# Patient Record
Sex: Female | Born: 1950 | Race: Black or African American | Hispanic: No | Smoking: Current every day smoker
Health system: Southern US, Community
[De-identification: ages and names within clinical notes are randomized; demographics above are authoritative.]

## PROBLEM LIST (undated history)

## (undated) DIAGNOSIS — K7460 Unspecified cirrhosis of liver: Secondary | ICD-10-CM

## (undated) HISTORY — PX: INTRAOCULAR LENS INSERTION: SHX110

---

## 2003-06-11 ENCOUNTER — Emergency Department (HOSPITAL_COMMUNITY): Admission: EM | Admit: 2003-06-11 | Discharge: 2003-06-11 | Payer: Self-pay | Admitting: Emergency Medicine

## 2004-05-09 ENCOUNTER — Ambulatory Visit: Payer: Self-pay | Admitting: Internal Medicine

## 2004-05-09 ENCOUNTER — Inpatient Hospital Stay (HOSPITAL_COMMUNITY): Admission: EM | Admit: 2004-05-09 | Discharge: 2004-05-13 | Payer: Self-pay | Admitting: Emergency Medicine

## 2004-05-20 ENCOUNTER — Ambulatory Visit: Payer: Self-pay | Admitting: Internal Medicine

## 2004-06-10 ENCOUNTER — Ambulatory Visit: Payer: Self-pay | Admitting: Internal Medicine

## 2008-03-07 ENCOUNTER — Emergency Department (HOSPITAL_COMMUNITY): Admission: EM | Admit: 2008-03-07 | Discharge: 2008-03-07 | Payer: Self-pay | Admitting: Family Medicine

## 2008-06-15 ENCOUNTER — Emergency Department (HOSPITAL_COMMUNITY): Admission: EM | Admit: 2008-06-15 | Discharge: 2008-06-15 | Payer: Self-pay | Admitting: Family Medicine

## 2010-11-22 NOTE — Discharge Summary (Signed)
NAME:  [name], [name] NO.:  [id number]   MEDICAL RECORD NO.:  [id number]          PATIENT TYPE:  INP   LOCATION:  5506                         FACILITY:  [facility]   PHYSICIAN:  Alvester Morin, M.D.  DATE OF BIRTH:  [birth year]   DATE OF ADMISSION:  05/09/2004  DATE OF DISCHARGE:  05/13/2004                                 DISCHARGE SUMMARY   DISCHARGE DIAGNOSES:  1.  Pyelonephritis.  2.  Urinary tract infection  3.  Hepatitis C virus positive.   DISCHARGE INSTRUCTIONS:  The patient was admitted in the hospital for acute  abdominal pain for four days.  Discharge medicine was Bactrim 1 tablet twice  a day for ten days.  The patient is to follow up in [facility] on  11/14 at 2 p.m. to be seen by Faythe Ghee and Dr. Eliane Decree, to get a stat  CBC, CMET, urinalysis, urine micro on that day, also to review the result of  hepatitis C virus, viral load and to discuss testing for human  immunodeficiency virus; if needed to get an appointment at hepatitis C  clinic.   PROCEDURE:  The patient got an ultrasound on 11/3 showing multiple small  gallstones.  No dilatation of gallbladder.  No signs of obstruction.  The  rest was normal.   CONSULTATIONS:  None.   HISTORY OF PRESENT ILLNESS:  [name] is a 60 year old very pleasant  African-American female patient who presented with acute abdominal pain for  four days, mostly in the right side flank area, not radiating, 5/10 on  scale, dull aching.  No aggravating or alleviating factors, not associated  with any meals.  The patient had taken Theraflu with very little relief.  The patient also had complained of nausea and vomiting for four days,  associated with incontinence, loose runny stool on the day of admission.  The patient had not noticed any bright red blood in the urine.  No burning  associated.   PHYSICAL EXAMINATION:  VITAL SIGNS:  Temperature 101.3, pulse between 78-  106, blood pressure 96/63,  respiratory rate 18, O2 saturation 96% on room  air.  GENERAL:  [name] built African-American lady in no acute distress.  HEENT:  Eyes:  Pupils round and reactive to light.  Extraocular movements  intact.  No scleral icterus.  Normocephalic.  Normal tympanic membranes.  Dry mucosa.  White coating on tongue.  Not easily removable.  Good condition  of oropharynx.  NECK:  Supple.  No bruit or JVD.  No lymphadenopathy.  No thyromegaly.  LUNGS:  Air entry bilaterally equal.  No rales or rhonchi appreciated.  CARDIOVASCULAR:  Tachycardia, regular rate and rhythm.  No murmurs, gallops,  rubs appreciated.  ABDOMEN:  Soft, mildly tender in right upper and lower quadrants, severe  tenderness right lateral side, point tenderness.  No organomegaly.  Bowel  sounds present.  Hyperactive on admission.  Costovertebral angle tenderness.  EXTREMITIES:  No edema, +2 pulses.  SKIN:  Dry, warm.  MUSCULOSKELETAL:  Moves all extremities.  NEUROLOGICAL:  Grossly nonfocal.  PSYCHIATRIC:  Oriented x3.  ADMISSION LABORATORY DATA:  Sodium 135, potassium 3.6, chloride 109, BUN 10,  creatinine 1.3, glucose 117.  Urinalysis showed small bilirubin, small  amount of blood, protein 30, urobilinogen was 8, large amount of urine.  Micro showed 21-50 wbc's, 3-6 rbc's and Trichomonads 5-10.  Hemoglobin 14.9,  hematocrit 42.4, WBC 18.3 and platelets 152.  ANC 13.8, MCV 96.5, lipase 42,  total bilirubin 2.1, direct 1.1, indirect bilirubin 1.0.  Alkaline  phosphatase 102, AST 45, ALT 54.  Total protein 8.2.  Albumin 3.1.   HOSPITAL COURSE/ASSESSMENT/PLAN:  Problem 1. Right upper quadrant abdominal  pain with right lumbar flank pain.  Differential diagnosis included  cholelithiasis, cholangitis, pancreatitis, pyelonephritis, urinary tract  infection and PID.  The patient was febrile.  WBC was elevated.  Due to  possibly of infective gastritis, pain was empirically treated with Cipro IV  and Flagyl IV.  Thinking this would  cover a broad spectrum organism, both  for acute abdomen and urinary tract infection.   Problem 2. Urinary tract infection, trichomoniasis.  The patient was treated  with metronidazole 1000 mg just one dose to cover trichomoniasis.  Urine did  show a few Escherichia coli on culture which was sensitive to antibiotics.  The patient was treated with Cipro 500 mg IV for three days, switched to  Cipro 500 mg b.i.d. for one day and on discharge was sent out on Septra 1000  b.i.d. for ten days.   Problem 3. Nausea, vomiting.  The patient was given Phenergan 25 mg q.4-6h.  p.r.n.   Problem 4. Deep venous thrombosis prophylaxis.  The patient was treated with  40 mg of Lovenox subcutaneously.   Problem 5. During admission, the patient also had three episodes of  hypertension which resolved promptly with IV fluids.   Problem 6. Hypokalemia which was repleted with single dose of KCl, 40 mEq,  and potassium went up to 4 from 3.4.  On discharge it was 3.8.   Problem 7. HCV antibody positive.  This was a coincident finding on  hepatitis panel.  Still pending viral load.  This is to be followed up in  clinic on 11/14.  The patient will be given an appointment at the hepatitis  clinic in [place].   DISCHARGE LABORATORY DATA:  Sodium 136, potassium 3.8, chloride 105,  bicarbonate 25, BUN 6, creatinine 1.3, glucose 91.  Hemoglobin 13.0,  hematocrit 36.7, WBC 10.7.  Glucose 145.   FOLLOWUP:  The patient follows up on 11/14 at 2 p.m. in the clinic to get  CBC, CMET, urinalysis and urine micro to monitor the effect of therapy.  Also to follow up with pending viral load for hepatitis C.  To discuss  testing for human immunodeficiency virus as mentioned earlier.       SP/MEDQ  D:  05/13/2004  T:  05/13/2004  Job:  [id number]   cc:   Donald Pore, MD  Internal Medicine Resident - [address]  [place]  [place] [address]  Fax: [phone]

## 2011-01-30 ENCOUNTER — Emergency Department (HOSPITAL_COMMUNITY)
Admission: EM | Admit: 2011-01-30 | Discharge: 2011-01-30 | Disposition: A | Discharge status: Home / Self Care | Payer: Self-pay | Attending: Emergency Medicine | Admitting: Emergency Medicine

## 2011-01-30 ENCOUNTER — Emergency Department (HOSPITAL_COMMUNITY): Payer: Self-pay

## 2011-01-30 DIAGNOSIS — E876 Hypokalemia: Secondary | ICD-10-CM | POA: Insufficient documentation

## 2011-01-30 DIAGNOSIS — R0602 Shortness of breath: Secondary | ICD-10-CM | POA: Insufficient documentation

## 2011-01-30 DIAGNOSIS — R42 Dizziness and giddiness: Secondary | ICD-10-CM | POA: Insufficient documentation

## 2011-01-30 DIAGNOSIS — R5381 Other malaise: Secondary | ICD-10-CM | POA: Insufficient documentation

## 2011-01-30 DIAGNOSIS — R5383 Other fatigue: Secondary | ICD-10-CM | POA: Insufficient documentation

## 2011-01-30 LAB — DIFFERENTIAL
Basophils Absolute: 0.1 10*3/uL (ref 0.0–0.1)
Basophils Relative: 1 % (ref 0–1)
Eosinophils Absolute: 0.1 10*3/uL (ref 0.0–0.7)
Eosinophils Relative: 2 % (ref 0–5)
Lymphocytes Relative: 54 % — ABNORMAL HIGH (ref 12–46)
Lymphs Abs: 3.7 10*3/uL (ref 0.7–4.0)
Monocytes Absolute: 0.8 10*3/uL (ref 0.1–1.0)
Neutro Abs: 2.3 10*3/uL (ref 1.7–7.7)
Neutrophils Relative %: 33 % — ABNORMAL LOW (ref 43–77)

## 2011-01-30 LAB — CBC
HCT: 40.8 % (ref 36.0–46.0)
Hemoglobin: 14.7 g/dL (ref 12.0–15.0)
MCHC: 36 g/dL (ref 30.0–36.0)
MCV: 95.3 fL (ref 78.0–100.0)
RBC: 4.28 MIL/uL (ref 3.87–5.11)
WBC: 6.9 10*3/uL (ref 4.0–10.5)

## 2011-01-30 LAB — BASIC METABOLIC PANEL
BUN: 13 mg/dL (ref 6–23)
CO2: 23 mEq/L (ref 19–32)
Calcium: 9.6 mg/dL (ref 8.4–10.5)
Chloride: 104 mEq/L (ref 96–112)
Creatinine, Ser: 1.06 mg/dL (ref 0.50–1.10)
GFR calc Af Amer: >60 mL/min (ref >60)
GFR calc non Af Amer: 53 mL/min — ABNORMAL LOW (ref >60)
Glucose, Bld: 112 mg/dL — ABNORMAL HIGH (ref 70–99)
Potassium: 3.3 mEq/L — ABNORMAL LOW (ref 3.5–5.1)
Sodium: 136 mEq/L (ref 135–145)

## 2011-01-30 LAB — CK TOTAL AND CKMB (NOT AT ARMC)
CK, MB: 2.9 ng/mL (ref 0.3–4.0)
Relative Index: 1.6 (ref 0.0–2.5)
Total CK: 185 U/L — ABNORMAL HIGH (ref 7–177)

## 2011-01-30 LAB — TROPONIN I: Troponin I: <0.3 ng/mL (ref <0.30)

## 2011-01-30 LAB — GLUCOSE, CAPILLARY: Glucose-Capillary: 131 mg/dL — ABNORMAL HIGH (ref 70–99)

## 2011-02-10 ENCOUNTER — Encounter: Payer: Self-pay | Admitting: Cardiovascular Disease

## 2011-02-11 ENCOUNTER — Encounter: Payer: Self-pay | Admitting: Cardiovascular Disease

## 2011-02-11 ENCOUNTER — Ambulatory Visit (INDEPENDENT_AMBULATORY_CARE_PROVIDER_SITE_OTHER): Payer: Self-pay | Admitting: Cardiovascular Disease

## 2011-02-11 VITALS — BP 128/76 | HR 78 | Ht 65.0 in | Wt 172.0 lb

## 2011-02-11 DIAGNOSIS — R42 Dizziness and giddiness: Secondary | ICD-10-CM

## 2011-02-11 NOTE — Assessment & Plan Note (Signed)
I do not think this is cardiac related. No further w/u at this time in this pt whose only cardiac risk factor is tobacco use. EKG normal. Cardiac panel negative last week. Exam normal. F/u prn.

## 2011-02-11 NOTE — Progress Notes (Signed)
History of Present Illness:59 yo female with h/o tobacco abuse here today to establish cardiology care. She was seen in the ED two weeks ago at [facility] after an episode of dizziness. She eased down to the floor. She then felt like she could not catch her breath. Normal head CT in the ED. CXR was normal. Her EKG was normal. No chest pain. Cardiac enzymes and d-dimer were normal. She has felt better at home. She has had some stomach pains with diarrhea. She has been taking in extra fluids. No recurrence of the dizziness, SOB since getting home. No fever or chills.   No past medical history on file.  Past Surgical History  Procedure Date  . Intraocular lens insertion     No current outpatient prescriptions on file.    Allergies not on file  History   Social History  . Marital Status: Widowed    Spouse Name: N/A    Number of Children: N/A  . Years of Education: N/A   Occupational History  . Not on file.   Social History Main Topics  . Smoking status: Current Everyday Smoker -- 0.0 packs/day    Types: Cigarettes  . Smokeless tobacco: Not on file  . Alcohol Use: Yes  . Drug Use: Not on file  . Sexually Active: Not on file   Other Topics Concern  . Not on file   Social History Narrative  . No narrative on file    No family history on file.  Review of Systems:  As stated in the HPI and otherwise negative.   BP 128/76  Pulse 78  Ht 5\' 5"  (1.651 m)  Wt 172 lb (78.019 kg)  BMI 28.62 kg/m2  Physical Examination: General: Well developed, well nourished, NAD HEENT: OP clear, mucus membranes moist SKIN: warm, dry. No rashes. Neuro: No focal deficits Musculoskeletal: Muscle strength 5/5 all ext Psychiatric: Mood and affect normal Neck: No JVD, no carotid bruits, no thyromegaly, no lymphadenopathy. Lungs:Clear bilaterally, no wheezes, rhonci, crackles Cardiovascular: Regular rate and rhythm. No murmurs, gallops or rubs. Abdomen:Soft. Bowel sounds present. Non-tender.    Extremities: No lower extremity edema. Pulses are 2 + in the bilateral DP/PT.  EKG: 01/30/11: Sinus rhythm. Short PR interval.

## 2011-04-11 LAB — URINE CULTURE: Colony Count: >=100000

## 2011-04-11 LAB — WET PREP, GENITAL: Yeast Wet Prep HPF POC: NONE SEEN

## 2011-04-11 LAB — GC/CHLAMYDIA PROBE AMP, GENITAL
Chlamydia, DNA Probe: NEGATIVE
GC Probe Amp, Genital: NEGATIVE

## 2011-04-11 LAB — POCT URINALYSIS DIP (DEVICE)
Glucose, UA: NEGATIVE mg/dL
Ketones, ur: NEGATIVE mg/dL
Nitrite: POSITIVE — AB
Protein, ur: 30 mg/dL — AB
Specific Gravity, Urine: 1.015 (ref 1.005–1.030)
Urobilinogen, UA: 2 mg/dL — ABNORMAL HIGH (ref 0.0–1.0)
pH: 5.5 (ref 5.0–8.0)

## 2013-04-28 ENCOUNTER — Other Ambulatory Visit: Payer: Self-pay | Admitting: Family Medicine

## 2013-04-28 DIAGNOSIS — R748 Abnormal levels of other serum enzymes: Secondary | ICD-10-CM

## 2013-04-28 DIAGNOSIS — D696 Thrombocytopenia, unspecified: Secondary | ICD-10-CM

## 2013-04-28 DIAGNOSIS — L299 Pruritus, unspecified: Secondary | ICD-10-CM

## 2013-05-05 ENCOUNTER — Ambulatory Visit
Admission: RE | Admit: 2013-05-05 | Discharge: 2013-05-05 | Disposition: A | Discharge status: Home / Self Care | Payer: Medicaid Other | Source: Ambulatory Visit | Attending: Family Medicine | Admitting: Family Medicine

## 2013-05-05 DIAGNOSIS — D696 Thrombocytopenia, unspecified: Secondary | ICD-10-CM

## 2013-05-05 DIAGNOSIS — R748 Abnormal levels of other serum enzymes: Secondary | ICD-10-CM

## 2013-05-05 DIAGNOSIS — L299 Pruritus, unspecified: Secondary | ICD-10-CM

## 2013-06-14 ENCOUNTER — Inpatient Hospital Stay (HOSPITAL_COMMUNITY)
Admission: EM | Admit: 2013-06-14 | Discharge: 2013-07-07 | DRG: 853 | Disposition: E | Payer: Medicaid Other | Attending: Critical Care Medicine | Admitting: Critical Care Medicine

## 2013-06-14 ENCOUNTER — Encounter (HOSPITAL_COMMUNITY): Payer: Self-pay | Admitting: Emergency Medicine

## 2013-06-14 DIAGNOSIS — E872 Acidosis, unspecified: Secondary | ICD-10-CM | POA: Diagnosis present

## 2013-06-14 DIAGNOSIS — K55069 Acute infarction of intestine, part and extent unspecified: Secondary | ICD-10-CM | POA: Diagnosis present

## 2013-06-14 DIAGNOSIS — J80 Acute respiratory distress syndrome: Secondary | ICD-10-CM | POA: Diagnosis present

## 2013-06-14 DIAGNOSIS — E162 Hypoglycemia, unspecified: Secondary | ICD-10-CM | POA: Diagnosis present

## 2013-06-14 DIAGNOSIS — Z79899 Other long term (current) drug therapy: Secondary | ICD-10-CM

## 2013-06-14 DIAGNOSIS — J9600 Acute respiratory failure, unspecified whether with hypoxia or hypercapnia: Secondary | ICD-10-CM | POA: Diagnosis not present

## 2013-06-14 DIAGNOSIS — G9340 Encephalopathy, unspecified: Secondary | ICD-10-CM | POA: Diagnosis present

## 2013-06-14 DIAGNOSIS — K7030 Alcoholic cirrhosis of liver without ascites: Secondary | ICD-10-CM | POA: Diagnosis present

## 2013-06-14 DIAGNOSIS — A419 Sepsis, unspecified organism: Principal | ICD-10-CM | POA: Diagnosis present

## 2013-06-14 DIAGNOSIS — F1010 Alcohol abuse, uncomplicated: Secondary | ICD-10-CM | POA: Diagnosis present

## 2013-06-14 DIAGNOSIS — J9601 Acute respiratory failure with hypoxia: Secondary | ICD-10-CM

## 2013-06-14 DIAGNOSIS — D684 Acquired coagulation factor deficiency: Secondary | ICD-10-CM | POA: Diagnosis present

## 2013-06-14 DIAGNOSIS — N179 Acute kidney failure, unspecified: Secondary | ICD-10-CM | POA: Diagnosis present

## 2013-06-14 DIAGNOSIS — K7460 Unspecified cirrhosis of liver: Secondary | ICD-10-CM

## 2013-06-14 DIAGNOSIS — F17200 Nicotine dependence, unspecified, uncomplicated: Secondary | ICD-10-CM | POA: Diagnosis present

## 2013-06-14 DIAGNOSIS — R188 Other ascites: Secondary | ICD-10-CM | POA: Diagnosis present

## 2013-06-14 DIAGNOSIS — K55059 Acute (reversible) ischemia of intestine, part and extent unspecified: Secondary | ICD-10-CM | POA: Diagnosis present

## 2013-06-14 DIAGNOSIS — D649 Anemia, unspecified: Secondary | ICD-10-CM | POA: Diagnosis not present

## 2013-06-14 DIAGNOSIS — D696 Thrombocytopenia, unspecified: Secondary | ICD-10-CM | POA: Diagnosis present

## 2013-06-14 DIAGNOSIS — D7589 Other specified diseases of blood and blood-forming organs: Secondary | ICD-10-CM | POA: Diagnosis not present

## 2013-06-14 HISTORY — DX: Unspecified cirrhosis of liver: K74.60

## 2013-06-14 LAB — CBC WITH DIFFERENTIAL/PLATELET
Basophils Absolute: 0 10*3/uL (ref 0.0–0.1)
Basophils Relative: 0 % (ref 0–1)
Eosinophils Absolute: 0 10*3/uL (ref 0.0–0.7)
Eosinophils Relative: 0 % (ref 0–5)
HCT: 37.1 % (ref 36.0–46.0)
Hemoglobin: 12.9 g/dL (ref 12.0–15.0)
Lymphocytes Relative: 14 % (ref 12–46)
Lymphs Abs: 0.7 10*3/uL (ref 0.7–4.0)
MCH: 35.4 pg — ABNORMAL HIGH (ref 26.0–34.0)
MCHC: 34.8 g/dL (ref 30.0–36.0)
MCV: 101.9 fL — ABNORMAL HIGH (ref 78.0–100.0)
Monocytes Absolute: 0.3 10*3/uL (ref 0.1–1.0)
Monocytes Relative: 7 % (ref 3–12)
Neutro Abs: 3.9 10*3/uL (ref 1.7–7.7)
Neutrophils Relative %: 79 % — ABNORMAL HIGH (ref 43–77)
Platelets: 76 10*3/uL — ABNORMAL LOW (ref 150–400)
RBC: 3.64 MIL/uL — ABNORMAL LOW (ref 3.87–5.11)
RDW: 16 % — ABNORMAL HIGH (ref 11.5–15.5)
WBC Morphology: INCREASED
WBC: 4.9 10*3/uL (ref 4.0–10.5)

## 2013-06-14 LAB — COMPREHENSIVE METABOLIC PANEL
ALT: 42 U/L — ABNORMAL HIGH (ref 0–35)
AST: 94 U/L — ABNORMAL HIGH (ref 0–37)
Albumin: 1.3 g/dL — ABNORMAL LOW (ref 3.5–5.2)
Alkaline Phosphatase: 257 U/L — ABNORMAL HIGH (ref 39–117)
BUN: 18 mg/dL (ref 6–23)
CO2: 12 mEq/L — ABNORMAL LOW (ref 19–32)
Calcium: 8.3 mg/dL — ABNORMAL LOW (ref 8.4–10.5)
Chloride: 105 mEq/L (ref 96–112)
Creatinine, Ser: 2.27 mg/dL — ABNORMAL HIGH (ref 0.50–1.10)
GFR calc Af Amer: 25 mL/min — ABNORMAL LOW (ref >90)
GFR calc non Af Amer: 22 mL/min — ABNORMAL LOW (ref >90)
Glucose, Bld: 35 mg/dL — CL (ref 70–99)
Potassium: 4.6 mEq/L (ref 3.5–5.1)
Sodium: 137 mEq/L (ref 135–145)
Total Bilirubin: 3.2 mg/dL — ABNORMAL HIGH (ref 0.3–1.2)
Total Protein: 7.2 g/dL (ref 6.0–8.3)

## 2013-06-14 LAB — CG4 I-STAT (LACTIC ACID): Lactic Acid, Venous: 11.52 mmol/L — ABNORMAL HIGH (ref 0.5–2.2)

## 2013-06-14 LAB — LIPASE, BLOOD: Lipase: 22 U/L (ref 11–59)

## 2013-06-14 LAB — GLUCOSE, CAPILLARY: Glucose-Capillary: 127 mg/dL — ABNORMAL HIGH (ref 70–99)

## 2013-06-14 MED ORDER — FENTANYL CITRATE 0.05 MG/ML IJ SOLN
50.0000 ug | Freq: Once | INTRAMUSCULAR | Status: AC
  Administered 2013-06-14: 50 ug via INTRAVENOUS
  Filled 2013-06-14: qty 2

## 2013-06-14 MED ORDER — ONDANSETRON HCL 4 MG/2ML IJ SOLN
4.0000 mg | Freq: Once | INTRAMUSCULAR | Status: AC
  Administered 2013-06-14: 4 mg via INTRAVENOUS
  Filled 2013-06-14: qty 2

## 2013-06-14 MED ORDER — DEXTROSE 5 % IV SOLN
2.0000 g | Freq: Once | INTRAVENOUS | Status: AC
  Administered 2013-06-14: 2 g via INTRAVENOUS
  Filled 2013-06-14: qty 2

## 2013-06-14 MED ORDER — DEXTROSE 50 % IV SOLN
1.0000 | Freq: Once | INTRAVENOUS | Status: AC
  Administered 2013-06-14: 50 mL via INTRAVENOUS

## 2013-06-14 MED ORDER — DEXTROSE 50 % IV SOLN
INTRAVENOUS | Status: AC
  Administered 2013-06-14: 50 mL via INTRAVENOUS
  Filled 2013-06-14: qty 50

## 2013-06-14 MED ORDER — IOHEXOL 300 MG/ML  SOLN
25.0000 mL | INTRAMUSCULAR | Status: AC
  Administered 2013-06-14: 25 mL via ORAL

## 2013-06-14 MED ORDER — SODIUM CHLORIDE 0.9 % IV BOLUS (SEPSIS)
1000.0000 mL | Freq: Once | INTRAVENOUS | Status: AC
  Administered 2013-06-14: 1000 mL via INTRAVENOUS

## 2013-06-14 NOTE — ED Provider Notes (Signed)
CSN: [id number]     Arrival date & time Jun 18, 2013  1753 History   First MD Initiated Contact with Patient 2013-06-18 1856     Chief Complaint  Patient presents with  . Abdominal Pain   (Consider location/radiation/quality/duration/timing/severity/associated sxs/prior Treatment) HPI Patient presents with concern of abdominal pain, dyspnea. Patient notes that she was recently diagnosed with cirrhosis. She is scheduled to see gastroenterology tomorrow. She notes over the past 2 or 3 days she has had increasing dyspnea, concurrently, she has developed worsening abdominal discomfort, distention. There is some diffuse soreness, but no overt pain. No fever, no chills, no vomiting, no diarrhea.  Patient does describe significant anxiety secondary to dyspnea. No quickly they are assessment factors.  Past Medical History  Diagnosis Date  . Cirrhosis of liver    Past Surgical History  Procedure Laterality Date  . Intraocular lens insertion     No family history on file. History  Substance Use Topics  . Smoking status: Current Every Day Smoker -- 0.00 packs/day    Types: Pipe  . Smokeless tobacco: Not on file  . Alcohol Use: Yes   OB History   Grav Para Term Preterm Abortions TAB SAB Ect Mult Living                 Review of Systems  Constitutional:       Per HPI, otherwise negative  HENT:       Per HPI, otherwise negative  Respiratory:       Per HPI, otherwise negative  Cardiovascular:       Per HPI, otherwise negative  Gastrointestinal: Negative for vomiting.  Endocrine:       Negative aside from HPI  Genitourinary:       Neg aside from HPI   Musculoskeletal:       Per HPI, otherwise negative  Skin: Negative.   Neurological: Negative for syncope.    Allergies  Review of patient's allergies indicates no known allergies.  Home Medications   Current Outpatient Rx  Name  Route  Sig  Dispense  Refill  . cyclobenzaprine (FLEXERIL) 5 MG tablet   Oral   Take 5 mg by  mouth at bedtime.         . Multiple Vitamin (MULTIVITAMIN WITH MINERALS) TABS tablet   Oral   Take 1 tablet by mouth daily.         . naproxen (NAPROSYN) 500 MG tablet   Oral   Take 500 mg by mouth 2 (two) times daily with a meal.         . naproxen sodium (ALEVE) 220 MG tablet   Oral   Take 440 mg by mouth daily as needed.         Marland Kitchen oxybutynin (DITROPAN) 5 MG tablet   Oral   Take 5 mg by mouth daily.         . Potassium 99 MG TABS   Oral   Take 99 mg by mouth at bedtime. Potassium gluconate         . traMADol (ULTRAM) 50 MG tablet   Oral   Take 50 mg by mouth daily as needed (pain).          BP 86/59  Pulse 135  Temp(Src) 97.7 F (36.5 C) (Oral)  Resp 26  SpO2 99% Physical Exam  Nursing note and vitals reviewed. Constitutional: She is oriented to person, place, and time. She appears well-developed and well-nourished. No distress.  HENT:  Head: Normocephalic and  atraumatic.  Eyes: Conjunctivae and EOM are normal.  Cardiovascular: Normal rate and regular rhythm.   Pulmonary/Chest: Effort normal and breath sounds normal. No stridor. No respiratory distress.  Abdominal: Soft. She exhibits distension. She exhibits no fluid wave. There is no tenderness.  Musculoskeletal: She exhibits no edema.  Neurological: She is alert and oriented to person, place, and time. She displays no atrophy and no tremor. No cranial nerve deficit or sensory deficit. She exhibits normal muscle tone. She displays no seizure activity. Coordination normal.  No asterixis  Skin: Skin is warm and dry.  Mildly jaundiced throughout  Psychiatric: She has a normal mood and affect.    ED Course  Procedures (including critical care time) Labs Review Labs Reviewed  CBC WITH DIFFERENTIAL  COMPREHENSIVE METABOLIC PANEL  LIPASE, BLOOD  APTT   Imaging Review No results found.  EKG Interpretation   None      After the initial evaluation I reviewed the patient's chart.  Update:  Initial labs notable for lactic acidosis.  On repeat exam the patient is hypotensive.  IV fluids are running.  Update: Patient's initial labs notable for hypoglycemia.  Patient received IV dextrose.  Update: Remaining labs notable for acute kidney injury.  Patient continues to receive IV fluids. On repeat exam the patient complains of diffuse abdominal discomfort as well as headache. Original ultrasound which was planned is now going to be replaced by a CT scan   11:07 PM Patient has headache, Fentanyl ordered She is sitting upright, speaking clearly.  Update: With her the patient's lactic acidosis, acute kidney injury, hypoglycemia, hypotension I discussed her care with her critical care team.  They will assist with the patient's evaluation and management.  On repeat exam patient's blood pressure has improved, though she continues to complain of pain.  With her abnormal labs she was started on empiric antibiotics, after blood cultures were obtained MDM  No diagnosis found. This patient presents with concern of ongoing abdominal discomfort, headache, generalized discomfort.  Notably, the patient's recent diagnosis of cirrhosis, but has not yet seen a specialist or had additional evaluation. On exam the patient is awake alert, interacting appropriately, but very uncomfortable appearing.  Throughout the patient's emergency room course become apparent that she has multiple abnormalities that need additional evaluation and management.  Most notably, patient's lactic acidosis, hypoglycemia, acute kidney injury, all consistent with progressive liver dysfunction.  Patient's hepatic enzymes are elevated.  Patient required additional evaluation and management by our critical care team and on sign out this was pending.   (late addendum) - on chart review, next day - the CT was grossly abnormal, with concern for pneumoperitoneum, pneumatosis and possible ischemia.  CRITICAL CARE Performed by: Gerhard Munch Total critical care time: 40 Critical care time was exclusive of separately billable procedures and treating other patients. Critical care was necessary to treat or prevent imminent or life-threatening deterioration. Critical care was time spent personally by me on the following activities: development of treatment plan with patient and/or surrogate as well as nursing, discussions with consultants, evaluation of patient's response to treatment, examination of patient, obtaining history from patient or surrogate, ordering and performing treatments and interventions, ordering and review of laboratory studies, ordering and review of radiographic studies, pulse oximetry and re-evaluation of patient's condition.    Gerhard Munch, MD 06-21-13 [phone]

## 2013-06-14 NOTE — ED Notes (Signed)
Pt. Was diagnosed with cirrhosis of liver 2 weeks ago and is having abdominal pain and distention and sob.   Pt. Is alert and oriented X4. Skin is warm and pale. n/v

## 2013-06-14 NOTE — ED Notes (Signed)
Notified MD(Lockwood) and RN Eileen Stanford) of elevated Lactic Acid of 11.52

## 2013-06-15 ENCOUNTER — Encounter (HOSPITAL_COMMUNITY): Payer: Medicaid Other | Admitting: Anesthesiology

## 2013-06-15 ENCOUNTER — Other Ambulatory Visit: Payer: Self-pay

## 2013-06-15 ENCOUNTER — Inpatient Hospital Stay (HOSPITAL_COMMUNITY): Payer: Medicaid Other

## 2013-06-15 ENCOUNTER — Encounter (HOSPITAL_COMMUNITY): Admission: EM | Disposition: E | Payer: Medicaid Other | Source: Home / Self Care | Attending: Critical Care Medicine

## 2013-06-15 ENCOUNTER — Encounter (HOSPITAL_COMMUNITY): Payer: Self-pay | Admitting: Critical Care Medicine

## 2013-06-15 ENCOUNTER — Inpatient Hospital Stay (HOSPITAL_COMMUNITY): Payer: Medicaid Other | Admitting: Anesthesiology

## 2013-06-15 DIAGNOSIS — K7460 Unspecified cirrhosis of liver: Secondary | ICD-10-CM

## 2013-06-15 DIAGNOSIS — J9589 Other postprocedural complications and disorders of respiratory system, not elsewhere classified: Secondary | ICD-10-CM

## 2013-06-15 DIAGNOSIS — J9601 Acute respiratory failure with hypoxia: Secondary | ICD-10-CM | POA: Diagnosis present

## 2013-06-15 DIAGNOSIS — E872 Acidosis: Secondary | ICD-10-CM | POA: Diagnosis present

## 2013-06-15 DIAGNOSIS — A419 Sepsis, unspecified organism: Secondary | ICD-10-CM

## 2013-06-15 DIAGNOSIS — K55069 Acute infarction of intestine, part and extent unspecified: Secondary | ICD-10-CM | POA: Diagnosis present

## 2013-06-15 DIAGNOSIS — J80 Acute respiratory distress syndrome: Secondary | ICD-10-CM | POA: Diagnosis present

## 2013-06-15 DIAGNOSIS — I999 Unspecified disorder of circulatory system: Secondary | ICD-10-CM

## 2013-06-15 DIAGNOSIS — J9600 Acute respiratory failure, unspecified whether with hypoxia or hypercapnia: Secondary | ICD-10-CM

## 2013-06-15 DIAGNOSIS — N179 Acute kidney failure, unspecified: Secondary | ICD-10-CM | POA: Diagnosis present

## 2013-06-15 DIAGNOSIS — D126 Benign neoplasm of colon, unspecified: Secondary | ICD-10-CM

## 2013-06-15 DIAGNOSIS — K7030 Alcoholic cirrhosis of liver without ascites: Secondary | ICD-10-CM | POA: Diagnosis present

## 2013-06-15 DIAGNOSIS — E162 Hypoglycemia, unspecified: Secondary | ICD-10-CM | POA: Diagnosis present

## 2013-06-15 DIAGNOSIS — D696 Thrombocytopenia, unspecified: Secondary | ICD-10-CM | POA: Diagnosis present

## 2013-06-15 HISTORY — PX: APPLICATION OF WOUND VAC: SHX5189

## 2013-06-15 HISTORY — PX: COLON RESECTION: SHX5231

## 2013-06-15 HISTORY — PX: LAPAROTOMY: SHX154

## 2013-06-15 LAB — CBC
HCT: 19.2 % — ABNORMAL LOW (ref 36.0–46.0)
Hemoglobin: 13.2 g/dL (ref 12.0–15.0)
Hemoglobin: 6.1 g/dL — CL (ref 12.0–15.0)
MCH: 35.7 pg — ABNORMAL HIGH (ref 26.0–34.0)
MCHC: 33 g/dL (ref 30.0–36.0)
MCHC: 31.8 g/dL (ref 30.0–36.0)
MCV: 108.1 fL — ABNORMAL HIGH (ref 78.0–100.0)
Platelets: 84 10*3/uL — ABNORMAL LOW (ref 150–400)
Platelets: 112 10*3/uL — ABNORMAL LOW (ref 150–400)
RBC: 1.76 MIL/uL — ABNORMAL LOW (ref 3.87–5.11)
RDW: 16.7 % — ABNORMAL HIGH (ref 11.5–15.5)
RDW: 16.8 % — ABNORMAL HIGH (ref 11.5–15.5)
WBC: 6.5 10*3/uL (ref 4.0–10.5)

## 2013-06-15 LAB — COMPREHENSIVE METABOLIC PANEL
ALT: 51 U/L — ABNORMAL HIGH (ref 0–35)
ALT: 176 U/L — ABNORMAL HIGH (ref 0–35)
AST: 132 U/L — ABNORMAL HIGH (ref 0–37)
AST: 649 U/L — ABNORMAL HIGH (ref 0–37)
Albumin: 1.4 g/dL — ABNORMAL LOW (ref 3.5–5.2)
Alkaline Phosphatase: 234 U/L — ABNORMAL HIGH (ref 39–117)
BUN: 20 mg/dL (ref 6–23)
Calcium: 7.6 mg/dL — ABNORMAL LOW (ref 8.4–10.5)
Creatinine, Ser: 3.71 mg/dL — ABNORMAL HIGH (ref 0.50–1.10)
GFR calc Af Amer: 18 mL/min — ABNORMAL LOW (ref >90)
GFR calc Af Amer: 14 mL/min — ABNORMAL LOW (ref >90)
Glucose, Bld: 72 mg/dL (ref 70–99)
Glucose, Bld: 92 mg/dL (ref 70–99)
Potassium: 5.1 mEq/L (ref 3.5–5.1)
Potassium: 4.7 mEq/L (ref 3.5–5.1)
Sodium: 135 mEq/L (ref 135–145)
Sodium: 147 mEq/L — ABNORMAL HIGH (ref 135–145)
Total Bilirubin: 2.8 mg/dL — ABNORMAL HIGH (ref 0.3–1.2)
Total Protein: 8.1 g/dL (ref 6.0–8.3)
Total Protein: 4.9 g/dL — ABNORMAL LOW (ref 6.0–8.3)

## 2013-06-15 LAB — BASIC METABOLIC PANEL
BUN: 19 mg/dL (ref 6–23)
Chloride: 106 mEq/L (ref 96–112)
Creatinine, Ser: 3.63 mg/dL — ABNORMAL HIGH (ref 0.50–1.10)
GFR calc Af Amer: 14 mL/min — ABNORMAL LOW (ref >90)
GFR calc non Af Amer: 12 mL/min — ABNORMAL LOW (ref >90)
Glucose, Bld: 98 mg/dL (ref 70–99)
Potassium: 5.2 mEq/L — ABNORMAL HIGH (ref 3.5–5.1)
Sodium: 139 mEq/L (ref 135–145)

## 2013-06-15 LAB — POCT I-STAT 7, (LYTES, BLD GAS, ICA,H+H)
Acid-base deficit: 25 mmol/L — ABNORMAL HIGH (ref 0.0–2.0)
Acid-base deficit: 11 mmol/L — ABNORMAL HIGH (ref 0.0–2.0)
Bicarbonate: 7.9 mEq/L — ABNORMAL LOW (ref 20.0–24.0)
Bicarbonate: 17.5 mEq/L — ABNORMAL LOW (ref 20.0–24.0)
Calcium, Ion: 0.77 mmol/L — ABNORMAL LOW (ref 1.13–1.30)
HCT: 30 % — ABNORMAL LOW (ref 36.0–46.0)
HCT: 18 % — ABNORMAL LOW (ref 36.0–46.0)
Hemoglobin: 6.1 g/dL — CL (ref 12.0–15.0)
O2 Saturation: 93 %
O2 Saturation: 96 %
Patient temperature: 34.9
Potassium: 4.4 mEq/L (ref 3.5–5.1)
Sodium: 141 mEq/L (ref 135–145)
Sodium: 149 mEq/L — ABNORMAL HIGH (ref 135–145)
TCO2: 9 mmol/L (ref 0–100)
TCO2: 19 mmol/L (ref 0–100)
pCO2 arterial: 50.7 mmHg — ABNORMAL HIGH (ref 35.0–45.0)
pH, Arterial: 7.134 — CL (ref 7.350–7.450)
pO2, Arterial: 115 mmHg — ABNORMAL HIGH (ref 80.0–100.0)
pO2, Arterial: 98 mmHg (ref 80.0–100.0)

## 2013-06-15 LAB — POCT I-STAT 3, ART BLOOD GAS (G3+)
Acid-base deficit: 14 mmol/L — ABNORMAL HIGH (ref 0.0–2.0)
Acid-base deficit: 19 mmol/L — ABNORMAL HIGH (ref 0.0–2.0)
Bicarbonate: 15.3 mEq/L — ABNORMAL LOW (ref 20.0–24.0)
O2 Saturation: 93 %
O2 Saturation: 78 %
Patient temperature: 94.1
Patient temperature: 95.4
TCO2: 14 mmol/L (ref 0–100)
pCO2 arterial: 55.3 mmHg — ABNORMAL HIGH (ref 35.0–45.0)
pO2, Arterial: 83 mmHg (ref 80.0–100.0)
pO2, Arterial: 62 mmHg — ABNORMAL LOW (ref 80.0–100.0)

## 2013-06-15 LAB — GLUCOSE, CAPILLARY
Glucose-Capillary: 105 mg/dL — ABNORMAL HIGH (ref 70–99)
Glucose-Capillary: 65 mg/dL — ABNORMAL LOW (ref 70–99)
Glucose-Capillary: 67 mg/dL — ABNORMAL LOW (ref 70–99)
Glucose-Capillary: 78 mg/dL (ref 70–99)
Glucose-Capillary: 80 mg/dL (ref 70–99)

## 2013-06-15 LAB — URINALYSIS, ROUTINE W REFLEX MICROSCOPIC
Glucose, UA: 100 mg/dL — AB
Protein, ur: 100 mg/dL — AB
Urobilinogen, UA: 1 mg/dL (ref 0.0–1.0)

## 2013-06-15 LAB — LACTIC ACID, PLASMA: Lactic Acid, Venous: 19.7 mmol/L — ABNORMAL HIGH (ref 0.5–2.2)

## 2013-06-15 LAB — AMMONIA: Ammonia: 119 umol/L — ABNORMAL HIGH (ref 11–60)

## 2013-06-15 LAB — PROTIME-INR
INR: 2.87 — ABNORMAL HIGH (ref 0.00–1.49)
INR: 2.42 — ABNORMAL HIGH (ref 0.00–1.49)
Prothrombin Time: 23.5 seconds — ABNORMAL HIGH (ref 11.6–15.2)

## 2013-06-15 LAB — URINE MICROSCOPIC-ADD ON

## 2013-06-15 LAB — BLOOD PRODUCT ORDER (VERBAL) VERIFICATION

## 2013-06-15 LAB — PREPARE RBC (CROSSMATCH)

## 2013-06-15 LAB — APTT: aPTT: 60 seconds — ABNORMAL HIGH (ref 24–37)

## 2013-06-15 LAB — MRSA PCR SCREENING: MRSA by PCR: NEGATIVE

## 2013-06-15 SURGERY — COLON RESECTION
Anesthesia: General | Site: Abdomen

## 2013-06-15 MED ORDER — SODIUM BICARBONATE 8.4 % IV SOLN
INTRAVENOUS | Status: DC
  Administered 2013-06-15: 04:00:00 via INTRAVENOUS
  Filled 2013-06-15 (×2): qty 150

## 2013-06-15 MED ORDER — FENTANYL CITRATE 0.05 MG/ML IJ SOLN
INTRAMUSCULAR | Status: DC | PRN
  Administered 2013-06-15: 50 ug via INTRAVENOUS
  Administered 2013-06-15: 100 ug via INTRAVENOUS
  Administered 2013-06-15 (×2): 50 ug via INTRAVENOUS

## 2013-06-15 MED ORDER — THIAMINE HCL 100 MG/ML IJ SOLN
100.0000 mg | Freq: Every day | INTRAMUSCULAR | Status: DC
  Administered 2013-06-15: 100 mg via INTRAVENOUS
  Filled 2013-06-15: qty 1

## 2013-06-15 MED ORDER — DEXTROSE 5 % IV SOLN
1.0000 g | INTRAVENOUS | Status: DC
  Administered 2013-06-15: 1 g via INTRAVENOUS
  Filled 2013-06-15: qty 10

## 2013-06-15 MED ORDER — NOREPINEPHRINE BITARTRATE 1 MG/ML IJ SOLN
2.0000 ug/min | INTRAMUSCULAR | Status: DC
  Administered 2013-06-15: 50 ug/min via INTRAVENOUS
  Administered 2013-06-15: 5 ug/min via INTRAVENOUS
  Administered 2013-06-16: 50 ug/min via INTRAVENOUS
  Filled 2013-06-15 (×4): qty 16

## 2013-06-15 MED ORDER — FENTANYL CITRATE 0.05 MG/ML IJ SOLN
INTRAMUSCULAR | Status: AC
  Filled 2013-06-15: qty 4

## 2013-06-15 MED ORDER — SODIUM CHLORIDE 0.9 % IV SOLN
1.0000 mg | Freq: Every day | INTRAVENOUS | Status: DC
  Administered 2013-06-15: 1 mg via INTRAVENOUS
  Filled 2013-06-15 (×2): qty 0.2

## 2013-06-15 MED ORDER — ALBUMIN HUMAN 5 % IV SOLN
INTRAVENOUS | Status: AC
  Administered 2013-06-15: 12.5 g via INTRAVENOUS
  Filled 2013-06-15: qty 250

## 2013-06-15 MED ORDER — CHLORHEXIDINE GLUCONATE 0.12 % MT SOLN
15.0000 mL | Freq: Two times a day (BID) | OROMUCOSAL | Status: DC
  Administered 2013-06-15 (×2): 15 mL via OROMUCOSAL
  Filled 2013-06-15: qty 15

## 2013-06-15 MED ORDER — PANTOPRAZOLE SODIUM 40 MG IV SOLR
40.0000 mg | Freq: Every day | INTRAVENOUS | Status: DC
  Administered 2013-06-16: 40 mg via INTRAVENOUS
  Filled 2013-06-15: qty 40

## 2013-06-15 MED ORDER — BIOTENE DRY MOUTH MT LIQD
15.0000 mL | Freq: Four times a day (QID) | OROMUCOSAL | Status: DC
  Administered 2013-06-15 – 2013-06-16 (×3): 15 mL via OROMUCOSAL

## 2013-06-15 MED ORDER — SODIUM CHLORIDE 0.9 % IV BOLUS (SEPSIS)
750.0000 mL | Freq: Once | INTRAVENOUS | Status: AC
  Administered 2013-06-15: 750 mL via INTRAVENOUS

## 2013-06-15 MED ORDER — SODIUM BICARBONATE 4.2 % IV SOLN
INTRAVENOUS | Status: DC | PRN
  Administered 2013-06-15 (×7): 50 meq via INTRAVENOUS

## 2013-06-15 MED ORDER — HYDROCORTISONE SOD SUCCINATE 100 MG IJ SOLR
50.0000 mg | Freq: Four times a day (QID) | INTRAMUSCULAR | Status: DC
  Administered 2013-06-15 – 2013-06-16 (×2): 50 mg via INTRAVENOUS
  Filled 2013-06-15 (×3): qty 1

## 2013-06-15 MED ORDER — SODIUM BICARBONATE 8.4 % IV SOLN
INTRAVENOUS | Status: DC
  Administered 2013-06-15: 13:00:00 via INTRAVENOUS
  Filled 2013-06-15 (×2): qty 850

## 2013-06-15 MED ORDER — KCL IN DEXTROSE-NACL 20-5-0.45 MEQ/L-%-% IV SOLN
Freq: Once | INTRAVENOUS | Status: AC
  Administered 2013-06-15: 50 mL/h via INTRAVENOUS
  Filled 2013-06-15: qty 1000

## 2013-06-15 MED ORDER — FENTANYL CITRATE 0.05 MG/ML IJ SOLN: 100.0000 ug | INTRAMUSCULAR | Status: DC | PRN

## 2013-06-15 MED ORDER — DEXTROSE 50 % IV SOLN
25.0000 mL | Freq: Once | INTRAVENOUS | Status: AC | PRN
  Administered 2013-06-15: 25 mL via INTRAVENOUS

## 2013-06-15 MED ORDER — SODIUM BICARBONATE 8.4 % IV SOLN
INTRAVENOUS | Status: DC
  Administered 2013-06-15 – 2013-06-16 (×2): via INTRAVENOUS
  Filled 2013-06-15 (×3): qty 150

## 2013-06-15 MED ORDER — MIDAZOLAM HCL 5 MG/5ML IJ SOLN
INTRAMUSCULAR | Status: DC | PRN
  Administered 2013-06-15 (×2): 1 mg via INTRAVENOUS

## 2013-06-15 MED ORDER — ETOMIDATE 2 MG/ML IV SOLN
20.0000 mg | Freq: Once | INTRAVENOUS | Status: AC
  Administered 2013-06-15: 20 mg via INTRAVENOUS

## 2013-06-15 MED ORDER — FENTANYL CITRATE 0.05 MG/ML IJ SOLN
100.0000 ug | INTRAMUSCULAR | Status: DC | PRN
  Administered 2013-06-15: 100 ug via INTRAVENOUS

## 2013-06-15 MED ORDER — VASOPRESSIN 20 UNIT/ML IJ SOLN
0.0300 [IU]/min | INTRAVENOUS | Status: DC
  Administered 2013-06-15: 0.03 [IU]/min via INTRAVENOUS
  Filled 2013-06-15: qty 2.5

## 2013-06-15 MED ORDER — SODIUM CHLORIDE 0.9 % IV SOLN
INTRAVENOUS | Status: DC
  Administered 2013-06-15: 05:00:00 via INTRAVENOUS

## 2013-06-15 MED ORDER — ALBUMIN HUMAN 5 % IV SOLN
12.5000 g | Freq: Once | INTRAVENOUS | Status: AC
  Administered 2013-06-15: 12.5 g via INTRAVENOUS

## 2013-06-15 MED ORDER — 0.9 % SODIUM CHLORIDE (POUR BTL) OPTIME
TOPICAL | Status: DC | PRN
  Administered 2013-06-15: 1000 mL

## 2013-06-15 MED ORDER — HYDROMORPHONE HCL PF 1 MG/ML IJ SOLN
0.5000 mg | Freq: Once | INTRAMUSCULAR | Status: AC
  Administered 2013-06-15: 0.5 mg via INTRAVENOUS

## 2013-06-15 MED ORDER — LACTATED RINGERS IV SOLN
INTRAVENOUS | Status: DC | PRN
  Administered 2013-06-15: 16:00:00 via INTRAVENOUS

## 2013-06-15 MED ORDER — ALBUMIN HUMAN 5 % IV SOLN
INTRAVENOUS | Status: DC | PRN
  Administered 2013-06-15: 17:00:00 via INTRAVENOUS

## 2013-06-15 MED ORDER — PHENYLEPHRINE HCL 10 MG/ML IJ SOLN
30.0000 ug/min | INTRAVENOUS | Status: DC
  Administered 2013-06-15: 100 ug/min via INTRAVENOUS
  Filled 2013-06-15 (×2): qty 1

## 2013-06-15 MED ORDER — PHENYLEPHRINE HCL 10 MG/ML IJ SOLN
30.0000 ug/min | INTRAVENOUS | Status: DC
  Administered 2013-06-15: 200 ug/min via INTRAVENOUS
  Filled 2013-06-15 (×3): qty 4

## 2013-06-15 MED ORDER — ALBUMIN HUMAN 5 % IV SOLN
INTRAVENOUS | Status: AC
  Administered 2013-06-15: 12.5 g
  Filled 2013-06-15: qty 250

## 2013-06-15 MED ORDER — ONDANSETRON HCL 4 MG/2ML IJ SOLN
4.0000 mg | Freq: Once | INTRAMUSCULAR | Status: AC
  Administered 2013-06-15: 4 mg via INTRAVENOUS
  Filled 2013-06-15: qty 2

## 2013-06-15 MED ORDER — SODIUM BICARBONATE 8.4 % IV SOLN
INTRAVENOUS | Status: AC
  Filled 2013-06-15: qty 100

## 2013-06-15 MED ORDER — MIDAZOLAM HCL 2 MG/2ML IJ SOLN
INTRAMUSCULAR | Status: AC
  Administered 2013-06-15: 2 mg
  Filled 2013-06-15: qty 4

## 2013-06-15 MED ORDER — ROCURONIUM BROMIDE 100 MG/10ML IV SOLN
INTRAVENOUS | Status: DC | PRN
  Administered 2013-06-15: 30 mg via INTRAVENOUS

## 2013-06-15 MED ORDER — METRONIDAZOLE IN NACL 5-0.79 MG/ML-% IV SOLN
500.0000 mg | Freq: Four times a day (QID) | INTRAVENOUS | Status: DC
  Administered 2013-06-15 (×2): 500 mg via INTRAVENOUS
  Filled 2013-06-15 (×3): qty 100

## 2013-06-15 MED ORDER — DEXTROSE 50 % IV SOLN
INTRAVENOUS | Status: AC
  Administered 2013-06-15: 25 mL via INTRAVENOUS
  Filled 2013-06-15: qty 50

## 2013-06-15 MED ORDER — DOPAMINE-DEXTROSE 3.2-5 MG/ML-% IV SOLN
2.0000 ug/kg/min | INTRAVENOUS | Status: DC
  Filled 2013-06-15: qty 250

## 2013-06-15 MED ORDER — FENTANYL CITRATE 0.05 MG/ML IJ SOLN
25.0000 ug | INTRAMUSCULAR | Status: DC | PRN
  Administered 2013-06-15: 50 ug via INTRAVENOUS
  Administered 2013-06-15: 100 ug via INTRAVENOUS
  Filled 2013-06-15 (×2): qty 2

## 2013-06-15 MED ORDER — SODIUM BICARBONATE 8.4 % IV SOLN
INTRAVENOUS | Status: DC
  Filled 2013-06-15: qty 1000

## 2013-06-15 MED ORDER — ALBUMIN HUMAN 5 % IV SOLN
12.5000 g | Freq: Once | INTRAVENOUS | Status: AC
  Administered 2013-06-15: 12.5 g via INTRAVENOUS
  Filled 2013-06-15: qty 250

## 2013-06-15 SURGICAL SUPPLY — 61 items
BLADE SURG 10 STRL SS (BLADE) ×1 IMPLANT
BLADE SURG 15 STRL LF DISP TIS (BLADE) IMPLANT
BLADE SURG 15 STRL SS (BLADE) ×3
BLADE SURG ROTATE 9660 (MISCELLANEOUS) IMPLANT
CANISTER SUCTION 2500CC (MISCELLANEOUS) ×3 IMPLANT
CANISTER WOUND CARE 500ML ATS (WOUND CARE) ×1 IMPLANT
CHLORAPREP W/TINT 26ML (MISCELLANEOUS) ×3 IMPLANT
COVER MAYO STAND STRL (DRAPES) ×6 IMPLANT
COVER SURGICAL LIGHT HANDLE (MISCELLANEOUS) ×3 IMPLANT
DRAPE LAPAROSCOPIC ABDOMINAL (DRAPES) ×3 IMPLANT
DRAPE PROXIMA HALF (DRAPES) ×6 IMPLANT
DRAPE UTILITY 15X26 W/TAPE STR (DRAPE) ×15 IMPLANT
DRAPE WARM FLUID 44X44 (DRAPE) ×3 IMPLANT
DRSG OPSITE POSTOP 4X10 (GAUZE/BANDAGES/DRESSINGS) IMPLANT
DRSG OPSITE POSTOP 4X8 (GAUZE/BANDAGES/DRESSINGS) IMPLANT
ELECT BLADE 6.5 EXT (BLADE) ×3 IMPLANT
ELECT CAUTERY BLADE 6.4 (BLADE) ×6 IMPLANT
ELECT REM PT RETURN 9FT ADLT (ELECTROSURGICAL) ×3
ELECTRODE REM PT RTRN 9FT ADLT (ELECTROSURGICAL) ×2 IMPLANT
GLOVE BIO SURGEON STRL SZ7.5 (GLOVE) ×2 IMPLANT
GLOVE BIOGEL PI IND STRL 7.5 (GLOVE) IMPLANT
GLOVE BIOGEL PI INDICATOR 7.5 (GLOVE) ×2
GLOVE SURG ORTHO 8.0 STRL STRW (GLOVE) ×6 IMPLANT
GOWN STRL NON-REIN LRG LVL3 (GOWN DISPOSABLE) ×11 IMPLANT
GOWN STRL REIN XL XLG (GOWN DISPOSABLE) ×6 IMPLANT
KIT BASIN OR (CUSTOM PROCEDURE TRAY) ×3 IMPLANT
KIT ROOM TURNOVER OR (KITS) ×3 IMPLANT
LEGGING LITHOTOMY PAIR STRL (DRAPES) IMPLANT
LIGASURE IMPACT 36 18CM CVD LR (INSTRUMENTS) ×1 IMPLANT
NS IRRIG 1000ML POUR BTL (IV SOLUTION) ×6 IMPLANT
PACK GENERAL/GYN (CUSTOM PROCEDURE TRAY) ×3 IMPLANT
PAD ARMBOARD 7.5X6 YLW CONV (MISCELLANEOUS) ×3 IMPLANT
PAD SHARPS MAGNETIC DISPOSAL (MISCELLANEOUS) ×1 IMPLANT
PENCIL BUTTON HOLSTER BLD 10FT (ELECTRODE) ×3 IMPLANT
RELOAD PROXIMATE 75MM BLUE (ENDOMECHANICALS) ×3 IMPLANT
RELOAD STAPLE 75 3.8 BLU REG (ENDOMECHANICALS) IMPLANT
SPECIMEN JAR X LARGE (MISCELLANEOUS) ×3 IMPLANT
SPONGE ABDOMINAL VAC ABTHERA (MISCELLANEOUS) ×1 IMPLANT
SPONGE LAP 18X18 X RAY DECT (DISPOSABLE) IMPLANT
STAPLER PROXIMATE 75MM BLUE (STAPLE) ×1 IMPLANT
STAPLER VISISTAT 35W (STAPLE) ×3 IMPLANT
SUCTION POOLE TIP (SUCTIONS) ×3 IMPLANT
SURGILUBE 2OZ TUBE FLIPTOP (MISCELLANEOUS) IMPLANT
SUT PDS AB 1 TP1 96 (SUTURE) ×4 IMPLANT
SUT PROLENE 2 0 CT2 30 (SUTURE) IMPLANT
SUT PROLENE 2 0 KS (SUTURE) IMPLANT
SUT SILK 2 0 (SUTURE) ×3
SUT SILK 2 0 SH CR/8 (SUTURE) ×3 IMPLANT
SUT SILK 2-0 18XBRD TIE 12 (SUTURE) ×2 IMPLANT
SUT SILK 3 0 (SUTURE) ×3
SUT SILK 3 0 SH CR/8 (SUTURE) ×3 IMPLANT
SUT SILK 3-0 18XBRD TIE 12 (SUTURE) ×2 IMPLANT
SUT VIC AB 3-0 SH 18 (SUTURE) ×2 IMPLANT
SYR BULB IRRIGATION 50ML (SYRINGE) ×2 IMPLANT
TOWEL OR 17X26 10 PK STRL BLUE (TOWEL DISPOSABLE) ×5 IMPLANT
TOWEL OR NON WOVEN STRL DISP B (DISPOSABLE) ×2 IMPLANT
TRAY FOLEY CATH 14FRSI W/METER (CATHETERS) IMPLANT
TRAY PROCTOSCOPIC FIBER OPTIC (SET/KITS/TRAYS/PACK) IMPLANT
TUBE CONNECTING 12X1/4 (SUCTIONS) ×3 IMPLANT
WATER STERILE IRR 1000ML POUR (IV SOLUTION) IMPLANT
YANKAUER SUCT BULB TIP NO VENT (SUCTIONS) ×3 IMPLANT

## 2013-06-15 NOTE — Procedures (Signed)
Central Venous Catheter Insertion Procedure Note [name] [id number] 1951/03/24  Procedure: Insertion of Central Venous Catheter Indications: Assessment of intravascular volume, Drug and/or fluid administration and Frequent blood sampling  Procedure Details Consent: Risks of procedure as well as the alternatives and risks of each were explained to the (patient/caregiver).  Consent for procedure obtained. Time Out: Verified patient identification, verified procedure, site/side was marked, verified correct patient position, special equipment/implants available, medications/allergies/relevent history reviewed, required imaging and test results available.  Performed  Maximum sterile technique was used including antiseptics, cap, gloves, gown, hand hygiene, mask and sheet. Skin prep: Chlorhexidine; local anesthetic administered A antimicrobial bonded/coated triple lumen catheter was placed in the right subclavian vein using the Seldinger technique. Ultrasound guidance used.yes Catheter placed to 16 cm. Blood aspirated via all 3 ports and then flushed x 3. Line sutured x 2 and dressing applied.  Evaluation Blood flow good Complications: No apparent complications Patient did tolerate procedure well. Chest X-ray ordered to verify placement.  CXR: pending.  I performed this procedure Shan Levans  06/09/2013, 7:22 AM

## 2013-06-15 NOTE — Anesthesia Preprocedure Evaluation (Addendum)
Anesthesia Evaluation  Patient identified by MRN, date of birth, ID band Patient awake and Patient unresponsive    Reviewed: Allergy & Precautions, H&P , NPO status , Patient's Chart, lab work & pertinent test results  Airway       Dental   Pulmonary COPDCurrent Smoker,  Intubated/ventilated     + intubated    Cardiovascular Rate:Tachycardia  Shock On inotropic support   Neuro/Psych    GI/Hepatic (+) Cirrhosis -       , Necrotic bowel   Endo/Other    Renal/GU ARFRenal disease     Musculoskeletal   Abdominal (+) + obese,  Abdomen: rigid.    Peds  Hematology   Anesthesia Other Findings intubated  Reproductive/Obstetrics                          Anesthesia Physical Anesthesia Plan  ASA: IV and emergent  Anesthesia Plan: General   Post-op Pain Management:    Induction: Intravenous  Airway Management Planned: Oral ETT  Additional Equipment: Arterial line  Intra-op Plan:   Post-operative Plan: Post-operative intubation/ventilation  Informed Consent: I have reviewed the patients History and Physical, chart, labs and discussed the procedure including the risks, benefits and alternatives for the proposed anesthesia with the patient or authorized representative who has indicated his/her understanding and acceptance.     Plan Discussed with: Surgeon and CRNA  Anesthesia Plan Comments:        Anesthesia Quick Evaluation

## 2013-06-15 NOTE — Consult Note (Signed)
Reason for Consult:  ischemic bowel.   Referring Physician: Dr. [name] is an 62 y.o. female.  HPI:  Pt is 62 yo female who came to ED with family for abdominal pain and distention, shortness of breath.  Was supposed to see GI today.  Pt has recent diagnosis of cirrhosis secondary to alcohol abuse.  Reportedly pt has been having symptoms for 2-3 days, but the patient cannot give me any of her story.  She was admitted to ICU with worsening mental status, and she has been complaining of pain overnight.    Past Medical History  Diagnosis Date  . Cirrhosis of liver     Past Surgical History  Procedure Laterality Date  . Intraocular lens insertion      No family history on file.  Social History:  reports that she has been smoking Pipe.  She does not have any smokeless tobacco history on file. She reports that she drinks alcohol. She reports that she does not use illicit drugs.  Allergies: No Known Allergies  Medications: I have reviewed the patient's current medications.  Results for orders placed during the hospital encounter of 07/01/2013 (from the past 48 hour(s))  CBC WITH DIFFERENTIAL     Status: Abnormal   Collection Time    07/02/2013  8:03 PM      Result Value Range   WBC 4.9  4.0 - 10.5 K/uL   RBC 3.64 (*) 3.87 - 5.11 MIL/uL   Hemoglobin 12.9  12.0 - 15.0 g/dL   HCT 69.6  29.5 - 28.4 %   MCV 101.9 (*) 78.0 - 100.0 fL   MCH 35.4 (*) 26.0 - 34.0 pg   MCHC 34.8  30.0 - 36.0 g/dL   RDW 13.2 (*) 44.0 - 10.2 %   Platelets 76 (*) 150 - 400 K/uL   Comment: PLATELET COUNT CONFIRMED BY SMEAR   Neutrophils Relative % 79 (*) 43 - 77 %   Lymphocytes Relative 14  12 - 46 %   Monocytes Relative 7  3 - 12 %   Eosinophils Relative 0  0 - 5 %   Basophils Relative 0  0 - 1 %   Neutro Abs 3.9  1.7 - 7.7 K/uL   Lymphs Abs 0.7  0.7 - 4.0 K/uL   Monocytes Absolute 0.3  0.1 - 1.0 K/uL   Eosinophils Absolute 0.0  0.0 - 0.7 K/uL   Basophils Absolute 0.0  0.0 - 0.1 K/uL   RBC  Morphology POLYCHROMASIA PRESENT     WBC Morphology INCREASED BANDS (>20% BANDS)     Comment: MILD LEFT SHIFT (1-5% METAS, OCC MYELO, OCC BANDS)     VACUOLATED NEUTROPHILS  COMPREHENSIVE METABOLIC PANEL     Status: Abnormal   Collection Time    06/13/2013  8:03 PM      Result Value Range   Sodium 137  135 - 145 mEq/L   Potassium 4.6  3.5 - 5.1 mEq/L   Chloride 105  96 - 112 mEq/L   CO2 12 (*) 19 - 32 mEq/L   Glucose, Bld 35 (*) 70 - 99 mg/dL   Comment: CRITICAL RESULT CALLED TO, READ BACK BY AND VERIFIED WITH:     GAGE,J RN 06/27/2013 2152 JORDANS     REPEATED TO VERIFY   BUN 18  6 - 23 mg/dL   Creatinine, Ser 7.25 (*) 0.50 - 1.10 mg/dL   Calcium 8.3 (*) 8.4 - 10.5 mg/dL   Total Protein 7.2  6.0 - 8.3 g/dL   Albumin 1.3 (*) 3.5 - 5.2 g/dL   AST 94 (*) 0 - 37 U/L   ALT 42 (*) 0 - 35 U/L   Alkaline Phosphatase 257 (*) 39 - 117 U/L   Total Bilirubin 3.2 (*) 0.3 - 1.2 mg/dL   GFR calc non Af Amer 22 (*) >90 mL/min   GFR calc Af Amer 25 (*) >90 mL/min   Comment: (NOTE)     The eGFR has been calculated using the CKD EPI equation.     This calculation has not been validated in all clinical situations.     eGFR's persistently <90 mL/min signify possible Chronic Kidney     Disease.  LIPASE, BLOOD     Status: None   Collection Time    07/05/2013  8:03 PM      Result Value Range   Lipase 22  11 - 59 U/L  APTT     Status: Abnormal   Collection Time    06/22/2013  8:03 PM      Result Value Range   aPTT 23 (*) 24 - 37 seconds  CG4 I-STAT (LACTIC ACID)     Status: Abnormal   Collection Time    07/05/2013  8:25 PM      Result Value Range   Lactic Acid, Venous 11.52 (*) 0.5 - 2.2 mmol/L  GLUCOSE, CAPILLARY     Status: Abnormal   Collection Time    06/10/2013 10:14 PM      Result Value Range   Glucose-Capillary 127 (*) 70 - 99 mg/dL  GLUCOSE, CAPILLARY     Status: Abnormal   Collection Time    06/21/2013 12:25 AM      Result Value Range   Glucose-Capillary 65 (*) 70 - 99 mg/dL  GLUCOSE,  CAPILLARY     Status: None   Collection Time    06/08/2013  2:29 AM      Result Value Range   Glucose-Capillary 78  70 - 99 mg/dL  MRSA PCR SCREENING     Status: None   Collection Time    06/20/2013  3:16 AM      Result Value Range   MRSA by PCR NEGATIVE  NEGATIVE   Comment:            The GeneXpert MRSA Assay (FDA     approved for NASAL specimens     only), is one component of a     comprehensive MRSA colonization     surveillance program. It is not     intended to diagnose MRSA     infection nor to guide or     monitor treatment for     MRSA infections.  GLUCOSE, CAPILLARY     Status: None   Collection Time    06/25/2013  3:16 AM      Result Value Range   Glucose-Capillary 77  70 - 99 mg/dL  AMMONIA     Status: Abnormal   Collection Time    06/25/2013  4:00 AM      Result Value Range   Ammonia 119 (*) 11 - 60 umol/L  CBC     Status: Abnormal   Collection Time    06/28/2013  4:10 AM      Result Value Range   WBC 6.5  4.0 - 10.5 K/uL   RBC 3.70 (*) 3.87 - 5.11 MIL/uL   Hemoglobin 13.2  12.0 - 15.0 g/dL   HCT 30.8  65.7 - 84.6 %  MCV 108.1 (*) 78.0 - 100.0 fL   MCH 35.7 (*) 26.0 - 34.0 pg   MCHC 33.0  30.0 - 36.0 g/dL   RDW 16.1 (*) 09.6 - 04.5 %   Platelets 84 (*) 150 - 400 K/uL   Comment: CONSISTENT WITH PREVIOUS RESULT  COMPREHENSIVE METABOLIC PANEL     Status: Abnormal   Collection Time    07/05/2013  4:10 AM      Result Value Range   Sodium 135  135 - 145 mEq/L   Potassium 5.1  3.5 - 5.1 mEq/L   Comment: HEMOLYSIS AT THIS LEVEL MAY AFFECT RESULT   Chloride 106  96 - 112 mEq/L   CO2 <7 (*) 19 - 32 mEq/L   Comment: CRITICAL RESULT CALLED TO, READ BACK BY AND VERIFIED WITH:     EPLING,G RN 07/01/2013 0526 JORDANS     REPEATED TO VERIFY   Glucose, Bld 72  70 - 99 mg/dL   BUN 20  6 - 23 mg/dL   Creatinine, Ser 4.09 (*) 0.50 - 1.10 mg/dL   Calcium 8.5  8.4 - 81.1 mg/dL   Total Protein 8.1  6.0 - 8.3 g/dL   Albumin 1.4 (*) 3.5 - 5.2 g/dL   AST 914 (*) 0 - 37 U/L    Comment: HEMOLYSIS AT THIS LEVEL MAY AFFECT RESULT   ALT 51 (*) 0 - 35 U/L   Comment: HEMOLYSIS AT THIS LEVEL MAY AFFECT RESULT   Alkaline Phosphatase 234 (*) 39 - 117 U/L   Total Bilirubin 3.8 (*) 0.3 - 1.2 mg/dL   GFR calc non Af Amer 15 (*) >90 mL/min   GFR calc Af Amer 18 (*) >90 mL/min   Comment: (NOTE)     The eGFR has been calculated using the CKD EPI equation.     This calculation has not been validated in all clinical situations.     eGFR's persistently <90 mL/min signify possible Chronic Kidney     Disease.  LACTIC ACID, PLASMA     Status: Abnormal   Collection Time    06/18/2013  4:10 AM      Result Value Range   Lactic Acid, Venous 11.5 (*) 0.5 - 2.2 mmol/L    Ct Abdomen Pelvis Wo Contrast  06/13/2013   CLINICAL DATA:  Hepatic cirrhosis; abdominal pain, distention and shortness of breath.  EXAM: CT ABDOMEN AND PELVIS WITHOUT CONTRAST  TECHNIQUE: Multidetector CT imaging of the abdomen and pelvis was performed following the standard protocol without intravenous contrast.  COMPARISON:  Abdominal ultrasound performed 05/05/2013  FINDINGS: Right basilar airspace opacification raises concern for pneumonia, given associated air bronchograms. The lung bases are not well assessed due to motion artifact. Mild left basilar atelectasis is seen. A prominent calcified granuloma is noted at the right lung base.  There is small to moderate volume ascites noted surrounding the liver, and tracking inferiorly along the paracolic gutters and about small bowel loops. A small amount of free fluid is seen within the pelvis. Scattered small foci of air are seen within the periphery of the liver, compatible with portal venous gas. No dominant hepatic mass is seen, though evaluation is suboptimal without contrast. Numerous calcified granulomata are seen within the spleen. Stones are noted layering dependently within the gallbladder; the gallbladder is difficult to fully assess due to surrounding fluid.  A  large amount of free air is seen within the portal venous system, raising suspicion for underlying bowel ischemia. The pancreas is grossly unremarkable in appearance,  though difficult to fully assess given adjacent fluid. The adrenal glands are within normal limits.  Nonspecific perinephric stranding and fluid are noted bilaterally. The kidneys are otherwise unremarkable in appearance. There is no evidence of hydronephrosis. No renal or ureteral stones are seen.  The small bowel is largely decompressed, aside from contrast within the proximal small bowel loops. There is wall thickening along the proximal jejunum, possibly reflecting an infectious or inflammatory process. The stomach is largely filled with contrast; a small hiatal hernia is seen. Contrast within the distal esophagus raises concern for underlying gastroesophageal reflux or esophageal dysmotility. Diffuse calcification is noted along the abdominal aorta and its branches.  There is diffuse wall thickening involving the cecum and ascending colon, and to a lesser extent the remainder of the colon. As described above, fluid is noted tracking along the paracolic gutters. There appears to be a small amount of air at the right paracolic gutter; this is thought to reflect pneumatosis. This may be secondary to ischemic colitis, or possibly an infectious or inflammatory colitis with associated necrosis.  The bladder is not well characterized due to adjacent ascites. The uterus is grossly unremarkable in appearance. The ovaries are relatively symmetric. No suspicious adnexal masses are seen. No inguinal lymphadenopathy is appreciated.  No acute osseous abnormalities are identified. Disc space narrowing and vacuum phenomenon are seen at L5-S1.  IMPRESSION: 1. Large amount of free air noted within the portal venous system, raising suspicion for underlying bowel ischemia. Scattered small foci of air within the periphery of the liver are also compatible with portal  venous gas. 2. Diffuse wall thickening involving the cecum and ascending colon, and to a lesser extent the remainder of the colon. There appears to be a small amount of air at the right pericolic gutter; this is thought reflect pneumatosis. This may be secondary to segmental ischemic colitis, or possibly an infectious or inflammatory colitis with associated necrosis, given the large amount of portal venous gas. 3. Small to moderate volume ascites noted surrounding the liver, tracking inferiorly along the paracolic gutters and into the pelvis. 4. Right basilar airspace opacification raises concern for pneumonia, given associated air bronchograms. 5. Small hiatal hernia seen. Contrast within the distal esophagus raises concern for underlying gastroesophageal reflux or esophageal dysmotility. 6. Wall thickening along the proximal jejunum may reflect an infectious or inflammatory process, or may be reactive in nature. 7. Diffuse calcification along the abdominal aorta and its branches.  These results were called by telephone at the time of interpretation on 07/06/2013 at 3:08 AM to the [facility] physician on call, who verbally acknowledged these results.   Electronically Signed   By: Roanna Raider M.D.   On: 07/01/2013 03:37    Review of Systems  Unable to perform ROS: mental status change   Blood pressure 122/99, pulse 113, temperature 97.7 F (36.5 C), temperature source Oral, resp. rate 27, height 5\' 6"  (1.676 m), weight 195 lb 8.8 oz (88.7 kg), SpO2 95.00%. Physical Exam  Constitutional: She appears well-developed and well-nourished. She appears distressed.  HENT:  Head: Normocephalic and atraumatic.  Right Ear: External ear normal.  Left Ear: External ear normal.  Nose: Nose normal.  Eyes: Conjunctivae are normal. Pupils are equal, round, and reactive to light. Right eye exhibits no discharge. Left eye exhibits no discharge. Scleral icterus is present.  Neck: Neck supple. No JVD present. No tracheal  deviation present. No thyromegaly present.  Cardiovascular: Regular rhythm, normal heart sounds and intact distal pulses.  Exam  reveals no gallop and no friction rub.   No murmur heard. Tachycardic   Respiratory: No stridor. She is in respiratory distress. She has no wheezes. She has no rales. She exhibits no tenderness.  Grunting.  GI: She exhibits distension. There is tenderness. There is rebound and guarding.  Musculoskeletal: She exhibits edema (tr diffuse edema). She exhibits no tenderness.  Lymphadenopathy:    She has no cervical adenopathy.  Neurological:  Disoriented.  Moaning, saying help me  Skin: Skin is warm and dry. No rash noted. She is not diaphoretic. No erythema. No pallor.  Psychiatric:  Unsure what baseline mental status is.  Reportedly was more cooperative with family around.      Assessment/Plan: Bowel ischemia Cirrhosis  Agree with fluid resuscitation and broad spectrum antibiotics.  Consider operative intervention.  Will get INR.   Unclear cause of ischemia.  Rising creatinine and decreased serum CO2 seem to indicate worsening.  Will get ABG to see global level of acidosis.   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  She has bili 3.8, albumin 1.4, ascites, and hyperammonemia.  Operative mortality around 80+%.  I feel that her survival is limited with or without operative intervention.    Will discuss with oncoming surgical team.    [name] 07-10-2013, 6:17 AM

## 2013-06-15 NOTE — Progress Notes (Signed)
General Surgery [facility]  Patient seen and evaluated again.  Hemodynamically unstable requiring pressor support.  Discussed with Dr. Sung Amabile.  Family at bedside including POA.  Discussed options for management and risks of operative and non-operative routes.  Family would like to proceed with surgery despite high risk if it might improve her chances of survival.  I suspect that she has ischemic or infarcted right colon based on CT findings and markedly elevated lactate level.  Plan exploratory laparotomy with possible bowel resection and possible ostomy placement.  Will transfuse FFP and platelets for coagulopathy.  I have asked anesthesia to evaluate patient in ICU.  The risks and benefits of the procedure have been discussed at length with the family.  The family understands the proposed procedure, potential alternative treatments, and the course of recovery to be expected.  All of their questions have been answered at this time.  They wish to proceed with surgery urgently.  Velora Heckler, MD, [facility] Office: [phone]

## 2013-06-15 NOTE — Significant Event (Signed)
ABG    Component Value Date/Time   PHART 7.088* 06/30/2013 1853   PCO2ART 48.9* 06/09/2013 1853   PO2ART 83.0 07/05/2013 1853   HCO3 15.3* 06/18/2013 1853   TCO2 17 06/29/2013 1853   ACIDBASEDEF 14.0* 06/24/2013 1853   O2SAT 93.0 06/19/2013 1853    Pt with metabolic/respiratory acidosis and hypotensive.    Will change to ARDS protocol, and increase RR.  Will increase rate of HCO3 infusion, and f/u ABG.  Coralyn Helling, MD 07/02/2013, 7:06 PM

## 2013-06-15 NOTE — H&P (Addendum)
PULMONARY  / CRITICAL CARE MEDICINE  Name: [name] MRN: [id number] DOB: 1951/02/22    ADMISSION DATE:  06/27/2013   REFERRING MD :  EDP PRIMARY SERVICE: PCCM  CHIEF COMPLAINT:   AMS, dyspnea  BRIEF PATIENT DESCRIPTION:  62 y.o.F hx of recent liver cirrhosis dz with ETOH use.  Adm with lactic acidosis, AMS and abd distension.  SIGNIFICANT EVENTS / STUDIES:  ABD CT 12/10>>  LINES / TUBES: PIV  CULTURES: none  ANTIBIOTICS: none  HISTORY OF PRESENT ILLNESS:   62 y.o.F with alcoholic liver cirrhosis adm with AMS and lactic acidosis.   Pt adm via Ed .  Pt with nausea, and dizziness.  Notes nausea and emesis.   PAST MEDICAL HISTORY :  Past Medical History  Diagnosis Date  . Cirrhosis of liver    Past Surgical History  Procedure Laterality Date  . Intraocular lens insertion     Prior to Admission medications   Medication Sig Start Date End Date Taking? Authorizing Provider  cyclobenzaprine (FLEXERIL) 5 MG tablet Take 5 mg by mouth at bedtime.   Yes Historical Provider, MD  Multiple Vitamin (MULTIVITAMIN WITH MINERALS) TABS tablet Take 1 tablet by mouth daily.   Yes Historical Provider, MD  naproxen (NAPROSYN) 500 MG tablet Take 500 mg by mouth 2 (two) times daily with a meal.   Yes Historical Provider, MD  naproxen sodium (ALEVE) 220 MG tablet Take 440 mg by mouth daily as needed.   Yes Historical Provider, MD  oxybutynin (DITROPAN) 5 MG tablet Take 5 mg by mouth daily.   Yes Historical Provider, MD  Potassium 99 MG TABS Take 99 mg by mouth at bedtime. Potassium gluconate   Yes Historical Provider, MD  traMADol (ULTRAM) 50 MG tablet Take 50 mg by mouth daily as needed (pain).   Yes Historical Provider, MD   No Known Allergies  FAMILY HISTORY:  No family history on file. SOCIAL HISTORY:  reports that she has been smoking Pipe.  She does not have any smokeless tobacco history on file. She reports that she drinks alcohol. She reports that she does not use illicit  drugs.  REVIEW OF SYSTEMS:  Not obtainable  SUBJECTIVE:   VITAL SIGNS: Temp:  [97.7 F (36.5 C)] 97.7 F (36.5 C) (12/09 1755) Pulse Rate:  [110-135] 110 (12/09 2259) Resp:  [18-33] 33 (12/09 2343) BP: (82-133)/(46-75) 133/75 mmHg (12/09 2343) SpO2:  [97 %-99 %] 98 % (12/09 2343) HEMODYNAMICS: CV stable      INTAKE / OUTPUT: Intake/Output   None     PHYSICAL EXAMINATION: General:  No distress Neuro:  Awake and alert, but some degree of somnolence HEENT:  Dry mucus membranes Cardiovascular:  RRR nl s1 s2 no s4 s3 Lungs:  clear Abdomen:  Distended, liver enlarged. Musculoskeletal:  from Skin:  clear  LABS:  CBC  Recent Labs Lab 06/27/13 2003  WBC 4.9  HGB 12.9  HCT 37.1  PLT 76*   Coag's  Recent Labs Lab 2013-06-27 2003  APTT 23*   BMET  Recent Labs Lab 2013-06-27 2003  NA 137  K 4.6  CL 105  CO2 12*  BUN 18  CREATININE 2.27*  GLUCOSE 35*   Electrolytes  Recent Labs Lab 06-27-13 2003  CALCIUM 8.3*   Sepsis Markers  Recent Labs Lab June 27, 2013 2025  LATICACIDVEN 11.52*   ABG No results found for this basename: PHART, PCO2ART, PO2ART,  in the last 168 hours Liver Enzymes  Recent Labs Lab 06-27-2013 2003  AST  94*  ALT 42*  ALKPHOS 257*  BILITOT 3.2*  ALBUMIN 1.3*   Cardiac Enzymes No results found for this basename: TROPONINI, PROBNP,  in the last 168 hours Glucose  Recent Labs Lab 2013/07/07 2214 06/07/2013 0025  GLUCAP 127* 65*    Imaging No results found.   CXR: pending  ASSESSMENT / PLAN: Principal Problem:   Cirrhosis Active Problems:   Lactic acidosis   Acute renal failure   Alcoholic cirrhosis of liver   Thrombocytopenia   Hypoglycemia   PULMONARY A:no active issues P:   Check cxr  CARDIOVASCULAR A: lactic acidosis, hemodynamically stable P:  monitor  RENAL A:  Acute renal failure, lactic acidosis P:   Bicarb drip F/u bmet  GASTROINTESTINAL A:  Alcoholic cirrhosis P:   chck ct  abd  HEMATOLOGIC A:  Mild anemia thrombocytopenia P:  monitor  INFECTIOUS A:  Possible SBP P:   F/u Ct Abd Give IV rocephin/flagyl   ENDOCRINE A:  Hypoglycemia d/t severe liver dz   P:   Substrate IV   NEUROLOGIC A:  AMS d/t liver disease P:   monitor  TODAY'S SUMMARY: 62 y.o.F with liver disease, ETOH, hypoglycemia and lactic acidosis.    I have personally obtained a history, examined the patient, evaluated laboratory and imaging results, formulated the assessment and plan and placed orders. CRITICAL CARE: The patient is critically ill with multiple organ systems failure and requires high complexity decision making for assessment and support, frequent evaluation and titration of therapies, application of advanced monitoring technologies and extensive interpretation of multiple databases. Critical Care Time devoted to patient care services described in this note is 60 minutes.   Dorcas Carrow Beeper  [phone]  Cell  [phone]  If no response or cell goes to voicemail, call beeper [phone]   Pulmonary and Critical Care Medicine [facility] Pager: [phone]  06/22/2013, 12:52 AM

## 2013-06-15 NOTE — Brief Op Note (Signed)
06-27-2013 - 06/07/2013  5:54 PM  PATIENT:  [name]  62 y.o. female  PRE-OPERATIVE DIAGNOSIS:  1. Ischemic bowel with portal venous gas  2. Marjo Bicker' C cirrhosis with ascites  3. Sepsis syndrome  POST-OPERATIVE DIAGNOSIS:  same  PROCEDURE:  Procedure(s): COLON RESECTION (N/A) EXPLORATORY LAPAROTOMY (N/A) APPLICATION OF WOUND VAC (N/A)  SURGEON:  Surgeon(s) and Role:    * Velora Heckler, MD - Primary    * Wilmon Arms. Corliss Skains, MD - Assisting  ANESTHESIA:   general  EBL:  Total I/O In: 1095.4 [I.V.:517.4; Blood:478; IV Piggyback:100] Out: 500 [Emesis/NG output:500]  BLOOD ADMINISTERED:2 U FFP  DRAINS: none and VAC abdominal dressing system   LOCAL MEDICATIONS USED:  NONE  SPECIMEN:  Excision  DISPOSITION OF SPECIMEN:  PATHOLOGY  COUNTS:  YES  TOURNIQUET:  * No tourniquets in log *  DICTATION: .Other Dictation: Dictation Number [id number]  PLAN OF CARE: Admit to inpatient   PATIENT DISPOSITION:  ICU - intubated and critically ill.   Delay start of Pharmacological VTE agent (>24hrs) due to surgical blood loss or risk of bleeding: yes  Velora Heckler, MD, [facility] Office: [phone]

## 2013-06-15 NOTE — Preoperative (Signed)
Beta Blockers   Reason not to administer Beta Blockers:Not Applicable 

## 2013-06-15 NOTE — Progress Notes (Signed)
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  Adm with lactic acidosis, AMS and abd distension.  SIGNIFICANT EVENTS / STUDIES:  ABD CT 12/10: Large amount of free air noted within the portal venous system. Diffuse wall thickening involving the cecum and ascending colon, and to a lesser extent the remainder of the colon. There appears to be a small amount of air at the right pericolic gutter; this is thought reflect pneumatosis. Small to moderate volume ascites noted surrounding the liver, tracking inferiorly along the paracolic gutters and into the pelvis  LINES / TUBES: ETT 12/10 >>  R [place] CVL 12/10 >>   CULTURES: MRSA PCR 12/10 >> NEG Blood 12/10 >>   ANTIBIOTICS: Zosyn 12/10 >>    SUBJECTIVE:  Unresponsive. Synchronous   VITAL SIGNS: Temp:  [97 F (36.1 C)-97.7 F (36.5 C)] 97 F (36.1 C) (12/10 1415) Pulse Rate:  [102-135] 111 (12/10 1430) Resp:  [16-36] 25 (12/10 1430) BP: (75-151)/(22-99) 96/59 mmHg (12/10 1430) SpO2:  [91 %-100 %] 96 % (12/10 1430) FiO2 (%):  [50 %-100 %] 50 % (12/10 1227) Weight:  [88.7 kg (195 lb 8.8 oz)] 88.7 kg (195 lb 8.8 oz) (12/10 0500) HEMODYNAMICS: CV stable CVP:  [9 mmHg-13 mmHg] 13 mmHg  Vent Mode:  [-] PRVC FiO2 (%):  [50 %-100 %] 50 % Set Rate:  [14 bmp-24 bmp] 24 bmp Vt Set:  [500 mL] 500 mL PEEP:  [5 cmH20] 5 cmH20 Plateau Pressure:  [29 cmH20-30 cmH20] 29 cmH20 INTAKE / OUTPUT: Intake/Output     12/09 0701 - 12/10 0700 12/10 0701 - 12/11 0700   I.V. (mL/kg) 305 (3.4) 517.4 (5.8)   Blood  215   IV Piggyback 800 100   Total Intake(mL/kg) 1105 (12.5) 832.4 (9.4)   Net +1105 +832.4          PHYSICAL EXAMINATION: General: intubated, unresponsive Neuro: no focal deficits  noted HEENT: mild sclericterus Cardiovascular:  RRR s M Lungs:  Clear anteriorly Abdomen:  Distended, firm, no BS. Ext: cool, no edema   LABS: I have reviewed all of today's lab results. Relevant abnormalities are discussed in the A/P section   CXR: evolving bilat as dz  ASSESSMENT / PLAN: Principal Problem:   Abdominal sepsis - likely bowel ischemia/infarction Active Problems:   Alcoholic cirrhosis   Severe lactic acidosis   Acute renal failure, anuric   Thrombocytopenia   Hypoglycemia   PULMONARY A: Acute resp failure Probable evolving ARDS (vs edema) P:   Vent settings reviewed and adjusted Vent bundle in place Daily SBT if/when indicated  CARDIOVASCULAR A:  Septic shock P:  CVP goal 12-15 MAP goal > 65 mmHg A-line ordered Cont NE VP added 12/10 Empiric stress dose steroids ordered 12/10  RENAL A:  Acute renal failure, oliguric Lactic acidosis due to bowel ischemia, impaired clearance P:   HCO3 infusion F/u bmet  GASTROINTESTINAL A:  Portal venous gas Pneumatosis coli Likely bowel ischemia/infarction  Alcoholic cirrhosis P:   SUP: IV PPI CCS following and considering ex lap/bowel resection - would be very high risk procedure   HEMATOLOGIC A:  Mild anemia Macrocytosis Thrombocytopenia, acute on chronic Coagulopathy of liver disease P:  CBC intermittently Folate ordered Transfuse 2 more units  FFP Transfuse per usual ICU guidelines  INFECTIOUS A:   Severe sepsis - abdominal source P:   Micro and abx as above  ENDOCRINE A:  Hypoglycemia d/t severe liver dz   P:   Changed to d10 Cont to monitor CBGs and provide dextrose as needed  NEUROLOGIC A:  Acute encephalopathy H/O EtOH abuse P:   Monitor Sedation as needed  TODAY'S SUMMARY:    Discussed with Dr Gerrit Friends. Trying to optimize status for possible surgery. Family updated @ bedside   I have personally obtained a history, examined the patient, evaluated laboratory and imaging  results, formulated the assessment and plan and placed orders. CRITICAL CARE: The patient is critically ill with multiple organ systems failure and requires high complexity decision making for assessment and support, frequent evaluation and titration of therapies, application of advanced monitoring technologies and extensive interpretation of multiple databases. Critical Care Time devoted to patient care services described in this note is 50 minutes.    Billy Fischer, MD ; [facility] [phone].  After 5:30 PM or weekends, call [phone]

## 2013-06-15 NOTE — Progress Notes (Signed)
Patient unresponsive on assessment this AM, Pocket book full of valuables, medications, and jewelry on the Patient. Unable to make contact with family via phone.  I logged this information and sent Medications to the Pharmacy and valuables to the Pharmacy. See shadow chart for information.   Jacqulyn Cane RN, BSN, CCRN

## 2013-06-15 NOTE — ED Notes (Signed)
CT paged. 

## 2013-06-15 NOTE — Progress Notes (Signed)
Pt returned from OR, w/ increased PIP, bloody secretions, RN aware, waiting for MD eval and orders.

## 2013-06-15 NOTE — Significant Event (Signed)
Hypotensive >> will give albumin.  Coralyn Helling, MD 06/28/2013, 10:41 PM

## 2013-06-15 NOTE — Progress Notes (Signed)
PCCM  CT abd back: large amount of portal venous gas. Air in colon wall. Poss ischemic bowel.  Pt with progressive abdominal distension.   Tachycardia and prob lower lobe infiltrate.  Plan to give more volume. I called surgery to see the pt.  Luisa Hart WrightMD

## 2013-06-15 NOTE — Significant Event (Signed)
Pt returned from OR.  Had Rt hemicolectomy and had large volume ascites drained during procedure.  She is hypotensive post-op.  Will give albumin IV, adjust pressors to keep MAP > 65.  Will repeat labs.  Coralyn Helling, MD July 03, 2013, 6:27 PM

## 2013-06-15 NOTE — Transfer of Care (Signed)
Immediate Anesthesia Transfer of Care Note  Patient: [name]  Procedure(s) Performed: Procedure(s): COLON RESECTION (N/A) EXPLORATORY LAPAROTOMY (N/A) APPLICATION OF WOUND VAC (N/A)  Patient Location: ICU  Anesthesia Type:General  Level of Consciousness: sedated, unresponsive and Patient remains intubated per anesthesia plan  Airway & Oxygen Therapy: Patient remains intubated per anesthesia plan and Patient placed on Ventilator (see vital sign flow sheet for setting)  Post-op Assessment: Report given to PACU RN  Post vital signs: Reviewed  Complications: No apparent anesthesia complications

## 2013-06-15 NOTE — ED Notes (Signed)
Spoke with CT.

## 2013-06-15 NOTE — Progress Notes (Signed)
Received referral from patient's RN, Thayer Ohm, who said that patient was diagnosed with liver cirrhosis a few weeks ago. Thayer Ohm requested support particularly for patient's 64 year old daughter, Ladona Ridgel. Chaplain visited with Ladona Ridgel and Fayrene Fearing, patient's partner. Ladona Ridgel was teary and obviously scared to see her mother very sick. Chaplain provided emotional and spiritual support, caring presence, and reflective listening.  Will follow up; Please page if needed.   Guy Sandifer [place], [place] [phone] On-call: [phone]

## 2013-06-15 NOTE — Significant Event (Signed)
CBC    Component Value Date/Time   WBC 6.5 2013/07/13 1840   RBC 1.76* 07/13/13 1840   HGB 6.1* 13-Jul-2013 1840   HCT 19.2* 07-13-13 1840   PLT 112* Jul 13, 2013 1840   MCV 109.1* 07-13-13 1840   MCH 34.7* Jul 13, 2013 1840   MCHC 31.8 2013/07/13 1840   RDW 16.8* 07-13-13 1840   LYMPHSABS 0.7 07/02/2013 2003   MONOABS 0.3 06/07/2013 2003   EOSABS 0.0 06/08/2013 2003   BASOSABS 0.0 06/25/2013 2003    Lab Results  Component Value Date   INR 2.42* 07-13-13   INR 2.17* 13-Jul-2013   INR 2.87* 2013/07/13   Will transfuse 2 units PRBC and 1 unit FFP.  Coralyn Helling, MD 13-Jul-2013, 7:29 PM

## 2013-06-15 NOTE — Progress Notes (Signed)
PCCM  Pt had episode emesis and desaturation.  Pt emergently intubated.  CVL also placed.  Will start pressors.  Pt will likely need OR but agree operative mortality is very high.  Will notify family.  Luisa Hart WrightMD

## 2013-06-15 NOTE — Progress Notes (Signed)
Retrieved valuables from security and gave them to Salome Arnt and Pt daughter, see shadow chart.  Jacqulyn Cane RN, BSN, CCRN

## 2013-06-15 NOTE — OR Nursing (Signed)
1740 unit called and notified that pt would be returning to unit in about 15 min from the OR

## 2013-06-15 NOTE — Progress Notes (Signed)
Hgb 6.3 called and reported to Dr. Craige Cotta.   Jacqulyn Cane RN, BSN, CCRN

## 2013-06-15 NOTE — Progress Notes (Signed)
Dr Craige Cotta notified of panic ABG results at 1859.  Waiting on new RT orders from MD, RN and oncoming RT aware.

## 2013-06-15 NOTE — Progress Notes (Signed)
Dr. Bard Herbert at bedside and given ABG results.  MD changed set rate to 24.

## 2013-06-15 NOTE — Progress Notes (Signed)
[facility]  Patient discussed with Dr. Donell Beers this morning.  Patient seen and examined.  Discussed with Dr. Sung Amabile at bedside.  Patient is a 62 yo BF with Marjo Bicker' C cirrhosis of the liver reportedly secondary to alcohol.  Now admitted with abdominal pain.  CTA shows thickened right colon consistent with colitis, presumably ischemic, and portal venous gas extending into liver.  Large volume of ascites.  No family present to discuss history or guide decisions regarding treatment at present.  Patient is at very high risk of morbidity / mortality with any operative intervention given hepatocellular disease and current multiorgan system failure.  Patient would require exploratory laparotomy and probable right colectomy if explored.  I have seen this condition successfully treated with IV abx's, bowel rest, and support.  However, if we delay operative intervention, we may miss our "window of opportunity", however narrow.  Dr. Sung Amabile will broaden antibiotic coverage and begin to correct coagulopathy (INR 2.8) with plasma and Vit K.  He will attempt to contact family and ascertain their wishes for patient's care.  I will return and reassess patient for possible surgery in 3 hours.  Velora Heckler, MD, [facility] Office: [phone]

## 2013-06-15 NOTE — Progress Notes (Addendum)
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  Adm with lactic acidosis, AMS and abd distension.  SIGNIFICANT EVENTS / STUDIES:  ABD CT 12/10: Large amount of free air noted within the portal venous system. Diffuse wall thickening involving the cecum and ascending colon, and to a lesser extent the remainder of the colon. There appears to be a small amount of air at the right pericolic gutter; this is thought reflect pneumatosis. Small to moderate volume ascites noted surrounding the liver, tracking inferiorly along the paracolic gutters and into the pelvis  LINES / TUBES: ETT 12/10 >>  R [place] CVL 12/10 >>   CULTURES: MRSA PCR 12/10 >> NEG Blood 12/10 >>   ANTIBIOTICS: Zosyn 12/10 >>    SUBJECTIVE:  Seen postop on vent.  Pt had colon segment removed R and left open  VITAL SIGNS: Temp:  [92.9 F (33.8 C)-97.7 F (36.5 C)] 94.2 F (34.6 C) (12/10 1915) Pulse Rate:  [101-126] 118 (12/10 1908) Resp:  [16-36] 28 (12/10 1915) BP: (58-151)/(22-99) 88/69 mmHg (12/10 1915) SpO2:  [89 %-100 %] 96 % (12/10 1908) FiO2 (%):  [50 %-100 %] 100 % (12/10 1908) Weight:  [88.7 kg (195 lb 8.8 oz)] 88.7 kg (195 lb 8.8 oz) (12/10 0500) HEMODYNAMICS: CV stable CVP:  [9 mmHg-14 mmHg] 14 mmHg  Vent Mode:  [-] PRVC FiO2 (%):  [50 %-100 %] 100 % Set Rate:  [14 bmp-28 bmp] 28 bmp Vt Set:  [500 mL] 500 mL PEEP:  [5 cmH20] 5 cmH20 Plateau Pressure:  [11 cmH20-40 cmH20] 40 cmH20 INTAKE / OUTPUT: Intake/Output     12/10 0701 - 12/11 0700   I.V. (mL/kg) 1746.6 (19.7)   Blood 1202   IV Piggyback 650.2   Total Intake(mL/kg) 3598.8 (40.6)   Urine (mL/kg/hr) 75 (0.1)   Emesis/NG output 500 (0.4)   Drains 350 (0.3)   Blood 300 (0.3)   Total Output 1225    Net +2373.8         PHYSICAL EXAMINATION: General: intubated,sedated Neuro: no focal deficits noted HEENT: mild sclericterus Cardiovascular:  RRR s M Lungs:  Clear anteriorly Abdomen: ABD with wound vac in place Ext: cool, no edema   LABS: Post op labs noted. Hgb 6.1   PH 7.00  CXR: evolving bilat as dz  ASSESSMENT / PLAN: Principal Problem:   Cirrhosis Active Problems:   Lactic acidosis   Acute renal failure   Alcoholic cirrhosis of liver   Thrombocytopenia   Hypoglycemia   Septic shock(785.52)   Bowel infarction   Acute respiratory failure with hypoxia   ARDS (adult respiratory distress syndrome)   Severe sepsis(995.92)    PULMONARY A: Acute resp failure  evolving ARDS (vs edema) P:   Move to 6cc/kg = 360 cc, increase rate to 35, peep to 8 Vent bundle in place Daily SBT if/when indicated   CARDIOVASCULAR A:  Septic shock Severe anemia postop contributes to shock state P:  CVP goal 12-15 MAP goal > 65 mmHg A-line ordered Cont NE VP added 12/10 Add neo  Empiric stress dose steroids ordered 12/10  RENAL A:  Acute renal failure, oliguric Lactic acidosis due to bowel ischemia, impaired clearance P:   HCO3 infusion cont same F/u bmet  GASTROINTESTINAL A:  Portal venous gas Pneumatosis coli Likely bowel ischemia/infarction  S/p ex lap with colectomy partial and left open with wound vac Alcoholic cirrhosis P:   SUP: IV PPI Per CCS Will need tpn eventually   HEMATOLOGIC A:  severe anemia post op Macrocytosis Thrombocytopenia, acute on chronic Coagulopathy of liver disease P:  CBC intermittently Folate ordered Transfuse 2  units Prbc FFP 2 units tfx   INFECTIOUS A:   Severe sepsis - abdominal source P:   Micro and abx as above  ENDOCRINE A:  Hypoglycemia d/t severe liver dz   P:   Changed to d10 Cont to monitor CBGs and provide dextrose as needed  NEUROLOGIC A:  Acute encephalopathy H/O EtOH abuse P:   Monitor Sedation  as needed  TODAY'S SUMMARY:    62 y.o.F with alcoholic cirrhosis, ischemic bowel s/p ex lap with severe sepsis>> now in ARDS postop with refractory shock, lactic acidosis. Very poor prognosis  I have personally obtained a history, examined the patient, evaluated laboratory and imaging results, formulated the assessment and plan and placed orders. CRITICAL CARE: The patient is critically ill with multiple organ systems failure and requires high complexity decision making for assessment and support, frequent evaluation and titration of therapies, application of advanced monitoring technologies and extensive interpretation of multiple databases. Critical Care Time devoted to patient care services described in this note is 30 minutes additional CCM TIME.    Dorcas Carrow Beeper  [phone]  Cell  [phone]  If no response or cell goes to voicemail, call beeper [phone]

## 2013-06-15 NOTE — Procedures (Signed)
Intubation Procedure Note [name] [id number] [birth year]  Procedure: Intubation Indications: Respiratory insufficiency  Procedure Details Consent: Risks of procedure as well as the alternatives and risks of each were explained to the (patient/caregiver).  Consent for procedure obtained. Time Out: Verified patient identification, verified procedure, site/side was marked, verified correct patient position, special equipment/implants available, medications/allergies/relevent history reviewed, required imaging and test results available.  Performed  [facility] Glidescope 4  Evaluation Hemodynamic Status: Transient hypotension treated with pressors and treated with fluid; O2 sats: stable throughout Patient's Current Condition: unstable Complications: No apparent complications Patient did tolerate procedure well. Chest X-ray ordered to verify placement.  CXR: pending.   Luisa Hart WrightMD 07/02/2013

## 2013-06-16 ENCOUNTER — Encounter (HOSPITAL_COMMUNITY): Payer: Self-pay | Admitting: Surgery

## 2013-06-16 ENCOUNTER — Inpatient Hospital Stay (HOSPITAL_COMMUNITY): Payer: Medicaid Other

## 2013-06-16 LAB — URINE CULTURE: Colony Count: NO GROWTH

## 2013-06-16 LAB — PREPARE PLATELET PHERESIS

## 2013-06-16 LAB — TYPE AND SCREEN
Antibody Screen: NEGATIVE
Unit division: 0
Unit division: 0

## 2013-06-16 LAB — POCT I-STAT 3, ART BLOOD GAS (G3+)
Acid-base deficit: 17 mmol/L — ABNORMAL HIGH (ref 0.0–2.0)
TCO2: 13 mmol/L (ref 0–100)
pCO2 arterial: 42.9 mmHg (ref 35.0–45.0)
pO2, Arterial: 65 mmHg — ABNORMAL LOW (ref 80.0–100.0)

## 2013-06-16 LAB — BASIC METABOLIC PANEL
CO2: 10 mEq/L — CL (ref 19–32)
GFR calc non Af Amer: 11 mL/min — ABNORMAL LOW (ref >90)
Glucose, Bld: 118 mg/dL — ABNORMAL HIGH (ref 70–99)
Potassium: 5 mEq/L (ref 3.5–5.1)
Sodium: 145 mEq/L (ref 135–145)

## 2013-06-16 LAB — CBC
Hemoglobin: 7.1 g/dL — ABNORMAL LOW (ref 12.0–15.0)
MCHC: 32.4 g/dL (ref 30.0–36.0)
RBC: 2.09 MIL/uL — ABNORMAL LOW (ref 3.87–5.11)
RDW: 18.7 % — ABNORMAL HIGH (ref 11.5–15.5)

## 2013-06-16 LAB — GLUCOSE, CAPILLARY: Glucose-Capillary: 94 mg/dL (ref 70–99)

## 2013-06-16 LAB — PREPARE FRESH FROZEN PLASMA
Unit division: 0
Unit division: 0
Unit division: 0

## 2013-06-16 MED ORDER — SODIUM BICARBONATE 8.4 % IV SOLN
100.0000 meq | Freq: Once | INTRAVENOUS | Status: AC
  Administered 2013-06-16: 100 meq via INTRAVENOUS

## 2013-06-16 MED ORDER — VANCOMYCIN HCL IN DEXTROSE 1-5 GM/200ML-% IV SOLN
1000.0000 mg | Freq: Once | INTRAVENOUS | Status: AC
  Administered 2013-06-16: 1000 mg via INTRAVENOUS
  Filled 2013-06-16: qty 200

## 2013-06-16 MED ORDER — PIPERACILLIN-TAZOBACTAM IN DEX 2-0.25 GM/50ML IV SOLN
2.2500 g | Freq: Four times a day (QID) | INTRAVENOUS | Status: DC
  Administered 2013-06-16: 2.25 g via INTRAVENOUS
  Filled 2013-06-16 (×3): qty 50

## 2013-06-16 MED ORDER — SODIUM BICARBONATE 8.4 % IV SOLN
INTRAVENOUS | Status: AC
  Administered 2013-06-16: 100 meq via INTRAVENOUS
  Filled 2013-06-16: qty 100

## 2013-06-16 MED ORDER — VANCOMYCIN HCL IN DEXTROSE 1-5 GM/200ML-% IV SOLN: 1000.0000 mg | INTRAVENOUS | Status: DC

## 2013-06-16 MED ORDER — FOLIC ACID 5 MG/ML IJ SOLN: 1.0000 mg | Freq: Every day | INTRAMUSCULAR | Status: DC

## 2013-06-17 LAB — POCT I-STAT 3, ART BLOOD GAS (G3+)
pCO2 arterial: 43.7 mmHg (ref 35.0–45.0)
pH, Arterial: 6.914 — CL (ref 7.350–7.450)

## 2013-06-19 LAB — PREPARE FRESH FROZEN PLASMA: Unit division: 0

## 2013-06-20 LAB — CULTURE, BLOOD (ROUTINE X 2)

## 2013-07-07 NOTE — Progress Notes (Signed)
ANTIBIOTIC CONSULT NOTE - INITIAL  Pharmacy Consult for Vancocin and Zosyn Indication: positive blood Cx  No Known Allergies  Patient Measurements: Height: 5\' 6"  (167.6 cm) Weight: 195 lb 8.8 oz (88.7 kg) IBW/kg (Calculated) : 59.3  Vital Signs: Temp: 96 F (35.6 C) (12/10 2230) Temp src: Oral (12/10 1600) BP: 64/40 mmHg (12/10 2300) Pulse Rate: 118 (12/10 2300) Intake/Output from previous day: 12/10 0701 - 12/11 0700 In: 6560.8 [I.V.:2330.6; Blood:2330; IV Piggyback:1900.2] Out: 1225 [Urine:75; Emesis/NG output:500; Drains:350; Blood:300] Intake/Output from this shift: Total I/O In: 2887 [I.V.:509; Blood:1128; IV Piggyback:1250] Out: -   Labs:  Recent Labs  06-24-13 0410 Jun 24, 2013 1325  2013/06/24 1733 06-24-2013 1840 06-24-2013 2346  WBC 6.5  --   --   --  6.5 PENDING  HGB 13.2  --   < > 6.1* 6.1* 7.1*  PLT 84*  --   --   --  112* 68*  CREATININE 3.01* 3.63*  --   --  3.71* 3.88*  < > = values in this interval not displayed. Estimated Creatinine Clearance: 16.7 ml/min (by C-G formula based on Cr of 3.88).    Microbiology: Recent Results (from the past 720 hour(s))  CULTURE, BLOOD (ROUTINE X 2)     Status: None   Collection Time    06/12/2013 11:40 PM      Result Value Range Status   Specimen Description BLOOD HAND LEFT   Final   Special Requests BOTTLES DRAWN AEROBIC ONLY 10CC   Final   Culture  Setup Time     Final   Value: 06-24-2013 03:33     Performed at [facility]   Culture     Final   Value: GRAM POSITIVE COCCI IN CLUSTERS     Note: Gram Stain Report Called to,Read Back By and Verified With: BRIAN MACK ON 06/23/2013 AT 12:12A BY WILEJ     Performed at [facility]   Report Status PENDING   Incomplete  MRSA PCR SCREENING     Status: None   Collection Time    06/24/13  3:16 AM      Result Value Range Status   MRSA by PCR NEGATIVE  NEGATIVE Final   Comment:            The GeneXpert MRSA Assay (FDA     approved for NASAL specimens   only), is one component of a     comprehensive MRSA colonization     surveillance program. It is not     intended to diagnose MRSA     infection nor to guide or     monitor treatment for     MRSA infections.    Medical History: Past Medical History  Diagnosis Date  . Cirrhosis of liver     Medications:  Prescriptions prior to admission  Medication Sig Dispense Refill  . cyclobenzaprine (FLEXERIL) 5 MG tablet Take 5 mg by mouth at bedtime.      . Multiple Vitamin (MULTIVITAMIN WITH MINERALS) TABS tablet Take 1 tablet by mouth daily.      . naproxen (NAPROSYN) 500 MG tablet Take 500 mg by mouth 2 (two) times daily with a meal.      . naproxen sodium (ALEVE) 220 MG tablet Take 440 mg by mouth daily as needed.      Marland Kitchen oxybutynin (DITROPAN) 5 MG tablet Take 5 mg by mouth daily.      . Potassium 99 MG TABS Take 99 mg by mouth at bedtime. Potassium  gluconate      . traMADol (ULTRAM) 50 MG tablet Take 50 mg by mouth daily as needed (pain).       Scheduled:  . antiseptic oral rinse  15 mL Mouth Rinse QID  . chlorhexidine  15 mL Mouth Rinse BID  . folic acid (FOLVITE) IVPB  1 mg Intravenous Daily  . hydrocortisone sod succinate (SOLU-CORTEF) inj  50 mg Intravenous Q6H  . pantoprazole (PROTONIX) IV  40 mg Intravenous QHS  . sodium bicarbonate      . thiamine  100 mg Intravenous Daily   Infusions:  . sodium chloride 50 mL/hr at 06/21/2013 0454  . norepinephrine (LEVOPHED) Adult infusion 50 mcg/min (06/27/2013 0004)  . phenylephrine (NEO-SYNEPHRINE) Adult infusion 200 mcg/min (07/03/2013 2121)  .  sodium bicarbonate  infusion 1000 mL 125 mL/hr at 06/19/2013 2100  . vasopressin (PITRESSIN) infusion - *FOR SHOCK* 0.03 Units/min (06/13/2013 1422)    Assessment: 63yo female admitted 12/10 for alcoholic cirrhosis, also w/ ARDS, ARF, and shock, now w/ GPC in blood Cx, to begin IV ABX.  Goal of Therapy:  Vancomycin trough level 15-20 mcg/ml  Plan:  Will begin vancomycin 1000mg  IV Q48H and Zosyn  2.25g IV Q6H and monitor CBC, SCr, C/S, levels prn.  Vernard Gambles, PharmD, BCPS  06/06/2013,12:37 AM

## 2013-07-07 NOTE — Progress Notes (Signed)
Pt expiration @ 0323. Cardiopulmonary arrest. Md. Dr Delford Field on the unit at the time and family present. Pt was mad a limited code blue. No cpr No defib. Pt asystole at 0323 am. Verified by RN and Md .

## 2013-07-07 NOTE — Discharge Summary (Signed)
Physician Discharge Summary     Patient ID: CASIA CORTI MRN: [id number] DOB/AGE: Feb 09, 1951 63 y.o.  DEATH SUMMARY Admit date: 2013/07/06 Death Discharge date: July 08, 2013 0325AM  Discharge Diagnoses:  Principal Problem:   Septic shock(785.52) Active Problems:   Cirrhosis   Lactic acidosis   Acute renal failure   Alcoholic cirrhosis of liver   Thrombocytopenia   Hypoglycemia   Bowel infarction   Acute respiratory failure with hypoxia   ARDS (adult respiratory distress syndrome)   Severe sepsis(995.92)   Detailed Hospital Course:  63 y.o.AAF admitted with AMS and lactic acidosis.  Prior hx of liver cirrhosis ETOH related. ABD CT showed evidence of bowel infarction advanced stage and pt subsequently intubated, placed on vasopressors.  Antibiotics continued.  Pt taken to OR PM of July 08, 2013 and R colectomy performed.  Pt very unstable postop and despite full aggressive care developed progressive bowel infarction and MODS.  Pt subsquently expired as a limited code at 325AM 07/08/13. No autopsy will be obtained.     Significant Hospital tests/ studies/ interventions and procedures  SIGNIFICANT EVENTS / STUDIES:  ABD CT 12/10: Large amount of free air noted within the portal venous system. Diffuse wall thickening involving the cecum and ascending colon, and to a lesser extent the remainder of the colon. There appears to be a small amount of air at the right pericolic gutter; this is thought reflect pneumatosis. Small to moderate volume ascites noted surrounding the liver, tracking inferiorly along the paracolic gutters and into the pelvis  LINES / TUBES:  ETT 12/10 >>  R [place] CVL 12/10 >>  CULTURES:  MRSA PCR 12/10 >> NEG  Blood 12/10 >>  ANTIBIOTICS: Zosyn/vancomycin postop Rocephin/flagyl given preop  Exploratory laprotomy with partial colectomy 06/10/2013.  Intubation /Mech vent support 08-Jul-2013  Consults: [facility].  Discharge Exam: BP 70/43   Pulse 36  Temp(Src) 97.8 F (36.6 C) (Core (Comment))  Resp 23  Ht 5\' 6"  (1.676 m)  Wt 88.7 kg (195 lb 8.8 oz)  BMI 31.58 kg/m2  SpO2 87%  Pt expired at 0325AM Jul 08, 2013  Labs at discharge Lab Results  Component Value Date   CREATININE 3.88* 06/09/2013   BUN 18 07/04/2013   NA 145 06/22/2013   K 5.0 06/06/2013   CL 104 06/14/2013   CO2 10* 06/27/2013   Lab Results  Component Value Date   WBC 7.5 06/09/2013   HGB 7.1* 06/07/2013   HCT 21.9* 07/01/2013   MCV 104.8* 07/02/2013   PLT 68* 06/12/2013   Lab Results  Component Value Date   ALT 176* 06/14/2013   AST 649* 06/14/2013   ALKPHOS 100 07/01/2013   BILITOT 2.8* 06/11/2013   Lab Results  Component Value Date   INR 2.42* 06/26/2013   INR 2.17* 06/19/2013   INR 2.87* 07/06/2013    Current radiology studies Ct Abdomen Pelvis Wo Contrast  06/27/2013   CLINICAL DATA:  Hepatic cirrhosis; abdominal pain, distention and shortness of breath.  EXAM: CT ABDOMEN AND PELVIS WITHOUT CONTRAST  TECHNIQUE: Multidetector CT imaging of the abdomen and pelvis was performed following the standard protocol without intravenous contrast.  COMPARISON:  Abdominal ultrasound performed 05/05/2013  FINDINGS: Right basilar airspace opacification raises concern for pneumonia, given associated air bronchograms. The lung bases are not well assessed due to motion artifact. Mild left basilar atelectasis is seen. A prominent calcified granuloma is noted at the right lung base.  There is small to moderate volume ascites noted surrounding the liver, and tracking inferiorly  along the paracolic gutters and about small bowel loops. A small amount of free fluid is seen within the pelvis. Scattered small foci of air are seen within the periphery of the liver, compatible with portal venous gas. No dominant hepatic mass is seen, though evaluation is suboptimal without contrast. Numerous calcified granulomata are seen within the spleen. Stones are noted layering  dependently within the gallbladder; the gallbladder is difficult to fully assess due to surrounding fluid.  A large amount of free air is seen within the portal venous system, raising suspicion for underlying bowel ischemia. The pancreas is grossly unremarkable in appearance, though difficult to fully assess given adjacent fluid. The adrenal glands are within normal limits.  Nonspecific perinephric stranding and fluid are noted bilaterally. The kidneys are otherwise unremarkable in appearance. There is no evidence of hydronephrosis. No renal or ureteral stones are seen.  The small bowel is largely decompressed, aside from contrast within the proximal small bowel loops. There is wall thickening along the proximal jejunum, possibly reflecting an infectious or inflammatory process. The stomach is largely filled with contrast; a small hiatal hernia is seen. Contrast within the distal esophagus raises concern for underlying gastroesophageal reflux or esophageal dysmotility. Diffuse calcification is noted along the abdominal aorta and its branches.  There is diffuse wall thickening involving the cecum and ascending colon, and to a lesser extent the remainder of the colon. As described above, fluid is noted tracking along the paracolic gutters. There appears to be a small amount of air at the right paracolic gutter; this is thought to reflect pneumatosis. This may be secondary to ischemic colitis, or possibly an infectious or inflammatory colitis with associated necrosis.  The bladder is not well characterized due to adjacent ascites. The uterus is grossly unremarkable in appearance. The ovaries are relatively symmetric. No suspicious adnexal masses are seen. No inguinal lymphadenopathy is appreciated.  No acute osseous abnormalities are identified. Disc space narrowing and vacuum phenomenon are seen at L5-S1.  IMPRESSION: 1. Large amount of free air noted within the portal venous system, raising suspicion for underlying  bowel ischemia. Scattered small foci of air within the periphery of the liver are also compatible with portal venous gas. 2. Diffuse wall thickening involving the cecum and ascending colon, and to a lesser extent the remainder of the colon. There appears to be a small amount of air at the right pericolic gutter; this is thought reflect pneumatosis. This may be secondary to segmental ischemic colitis, or possibly an infectious or inflammatory colitis with associated necrosis, given the large amount of portal venous gas. 3. Small to moderate volume ascites noted surrounding the liver, tracking inferiorly along the paracolic gutters and into the pelvis. 4. Right basilar airspace opacification raises concern for pneumonia, given associated air bronchograms. 5. Small hiatal hernia seen. Contrast within the distal esophagus raises concern for underlying gastroesophageal reflux or esophageal dysmotility. 6. Wall thickening along the proximal jejunum may reflect an infectious or inflammatory process, or may be reactive in nature. 7. Diffuse calcification along the abdominal aorta and its branches.  These results were called by telephone at the time of interpretation on 07-06-13 at 3:08 AM to the E-Link physician on call, who verbally acknowledged these results.   Electronically Signed   By: Roanna Raider M.D.   On: Jul 06, 2013 03:37   Dg Chest Port 1 View  2013-07-06   CLINICAL DATA:  Respiratory insufficiency after vomiting.  EXAM: PORTABLE CHEST - 1 VIEW  COMPARISON:  07-06-2013  FINDINGS:  Endotracheal tube tip is 5 mm above the carinal and could be retracted slightly. NG tube tip is below the diaphragm. Central line is in good position. There are patchy infiltrates in the right upper and lower lobe and there is slight interstitial accentuation in the left lung. The findings could represent interstitial pneumonitis secondary to aspiration. Heart size and vascularity are normal. No effusions.  IMPRESSION: Probable  aspiration pneumonitis. Endotracheal tube needs to be repositioned.  Critical Value/emergent results were called by telephone at the time of interpretation on 2013/06/19 at 7:38 AM to [facility], RN, who verbally acknowledged these results.   Electronically Signed   By: Geanie Cooley M.D.   On: June 19, 2013 07:40   Dg Chest Port 1 View  06/19/2013   CLINICAL DATA:  Liver disease, shortness of breath.  EXAM: PORTABLE CHEST - 1 VIEW  COMPARISON:  Is radiographed January 30, 2011  FINDINGS: Cardiomediastinal silhouette is unremarkable, mild interstitial prominence with strandy densities in the lung bases in this low inspiratory portable examination with crowded vasculature markings. Right lower lobe calcified granuloma. No pleural effusions. No pneumothorax.  Soft tissue planes and included osseous structures are nonsuspicious. Multiple EKG lines overlie the patient and may obscure subtle underlying pathology.  IMPRESSION: Mild interstitial prominence may reflect edema, with bibasilar strandy densities favoring atelectasis.   Electronically Signed   By: Awilda Metro   On: 06/19/2013 06:45    Signed: Luisa Hart WrightMD   06/08/2013, 3:21 AM

## 2013-07-07 NOTE — Progress Notes (Signed)
eLink Physician-Brief Progress Note Patient Name: [name] DOB: 18-Jul-1950 MRN: [id number]  Date of Service  06/08/2013   HPI/Events of Note  Blood cultures positive for gram positive cocci in clusters aerobic bottle - ABX d/ced.   eICU Interventions  Plan: Restart ABX - vanc and zosyn until ID and sensitives available.   Intervention Category Intermediate Interventions: Infection - evaluation and management  DETERDING,ELIZABETH 06/07/2013, 12:31 AM

## 2013-07-07 NOTE — Progress Notes (Signed)
Chaplain responded to page from nurse on 75M to visit family of pt who is actively dying.  Chaplain gave support to pt's husband and daughter by visiting with them and listening empathically to expressions of sadness.     07/03/2013 0300  Clinical Encounter Type  Visited With Family;Patient not available  Visit Type Spiritual support;Patient actively dying  Spiritual Encounters  Spiritual Needs Grief support    Rulon Abide, chaplain pager, [phone]

## 2013-07-07 NOTE — Anesthesia Postprocedure Evaluation (Signed)
  Anesthesia Post-op Note  Patient: [name]  Procedure(s) Performed: Procedure(s): COLON RESECTION (N/A) EXPLORATORY LAPAROTOMY (N/A) APPLICATION OF WOUND VAC (N/A)  Patient deceased

## 2013-07-07 NOTE — Progress Notes (Signed)
Have attempted to contact family concerning patient condition but have not been successful. Will continue to call.

## 2013-07-07 NOTE — Procedures (Signed)
Extubation Procedure Note  Patient Details:   Name: [name] DOB: 04-15-1951 MRN: [id number]   Airway Documentation:  Airway 7.5 mm (Active)  Secured at (cm) 23 cm 06/20/2013 11:00 PM  Measured From Lips 06/26/2013 11:00 PM  Secured Location Center 06/08/2013 11:00 PM  Secured By [employer] 06/08/2013 11:00 PM  Tube Holder Repositioned Yes 06/09/2013 11:00 PM  Cuff Pressure (cm H2O) 25 cm H2O 07/06/2013  7:08 PM  Site Condition Dry 06/13/2013  6:19 PM    Evaluation  O2 sats: transiently fell during during procedure Complications: No apparent complications Patient did tolerate procedure well. Bilateral Breath Sounds: Rhonchi Suctioning: Airway No  Terminal extubation per MD.   Gaetano Hawthorne 06/29/2013, 3:32 AM

## 2013-07-07 NOTE — Progress Notes (Signed)
eLink Physician-Brief Progress Note Patient Name: [name] DOB: Nov 05, 1950 MRN: [id number]  Date of Service  06/15/2013   HPI/Events of Note  Ongoing severe metabolic acidosis despite vent adjustments and bicarb infusions.  Also with hypoxia on current ABG.   eICU Interventions  Plan: 2 amps of bicarb IVP Increase bicarb gtt to 175 cc/hr Increase peep on vent to 12 ABG in approx 2 to 3 hours   Intervention Category Major Interventions: Acid-Base disturbance - evaluation and management  Avarey Yaeger 06/27/2013, 1:29 AM

## 2013-07-07 NOTE — Progress Notes (Signed)
eLink Physician-Brief Progress Note Patient Name: [name] DOB: 09/02/1950 MRN: [id number]  Date of Service  06/08/2013   HPI/Events of Note  Ongoing severe metabolic acidosis in the setting of intractable shock.  Current pH 6.9/55/62/12 with Art BP of 70/30 on max doses of NE, NEO, vasopressin and steroids.   eICU Interventions  Plan: 2 amps of bicarb IVP Recheck ABG in 30 minutes Consider increase in bicarb gtt   Intervention Category Major Interventions: Acid-Base disturbance - evaluation and management;Shock - evaluation and management  Glorine Hanratty 07/01/2013, 12:02 AM

## 2013-07-07 NOTE — Op Note (Signed)
NAME:  [name], [name]               ACCOUNT NO.:  [id number]  MEDICAL RECORD NO.:  [id number]  LOCATION:  2M08C                        FACILITY:  [facility]  PHYSICIAN:  Velora Heckler, MD      DATE OF BIRTH:  03/28/1951  DATE OF PROCEDURE:  06/24/2013                              OPERATIVE REPORT   PREOPERATIVE DIAGNOSES: 1. Colonic ischemia. 2. End-stage cirrhosis with ascites. 3. Severe sepsis.  POSTOPERATIVE DIAGNOSES: 1. Colonic ischemia. 2. End-stage cirrhosis with ascites. 3. Severe sepsis.  PROCEDURE: 1. Exploratory laparotomy. 2. Right colectomy. 3. Application of vacuum dressing.  SURGEON:  Velora Heckler, MD, FACS  ASSISTANT:  Wilmon Arms. Corliss Skains, MD, FACS  ANESTHESIA:  General per Dr. Bedelia Person.  ESTIMATED BLOOD LOSS:  300 mL.  PREPARATION:  ChloraPrep.  COMPLICATIONS:  None.  INDICATIONS:  The patient is a 63 year old female admitted to the Medical Intensive Care Unit with abdominal pain.  Evaluation included a CT scan, which showed severe alcoholic cirrhosis of the liver with ascites, probable ischemic colitis of the right colon with pneumatosis, and significant portal venous gas.  General Surgery was consulted. After initial resuscitation and discussion with the family, a decision was made to proceed with operative intervention despite the very high risk of morbidity and mortality.  The patient was therefore prepared for the operating room with correction of her coagulopathy.  She is now brought to the operating room for exploration.  BODY OF REPORT:  Procedure was done in OR #1 at the [facility].  The patient was brought to the operating room, placed in a supine position on the operating room table.  Following administration of general anesthesia, the patient was prepped and draped in the usual aseptic fashion.  After ascertaining that an adequate level of anesthesia had been achieved, a midline abdominal incision was made with a #10  blade.  Dissection was carried through subcutaneous tissues. Fascia was incised in the midline and the peritoneal cavity was entered cautiously.  Approximately 1 mL of ascitic fluid was evacuated.  This was yellow in color and somewhat cloudy in the pelvis.  The incision was extended cephalad and caudad in the midline.  Small bowel is inspected. There are areas of ischemia, but no evidence of perforation or infarction.  Most of the ischemia appears to be in the proximal jejunum. Inspection of the liver shows end-stage micronodular cirrhosis.  The sigmoid colon, rectum, descending colon, and transverse colon are all viable.  The right colon however is dense, thickened, dark in color with evident pneumatosis in the bowel wall consistent with infarction. Decision was made to proceed with right colectomy.  Balfour retractor was placed for exposure.  The lateral peritoneal attachments were incised with the electrocautery.  Dissection was carried around the hepatic flexure and the lesser omentum is divided with the LigaSure. Dissection was carried into the proximal transverse colon, which is then transected with a GIA stapler.  Terminal ileum is mobilized with some difficulty in the right lower quadrant.  Appendix is somewhat adherent to the retroperitoneum as if there has been previous inflammatory changes.  Peritoneum is incised and the terminal ileum, appendix, and base of the cecum  are mobilized off the retroperitoneum and hemostasis achieved with the electrocautery.  A point in the distal terminal ileum was selected and transected with a GIA stapler.  The mesentery of the terminal ileum is markedly shortened likely secondary to edema and previous inflammation.  The remaining colonic mesentery is divided with the ligature and the right colon is delivered from the abdomen and submitted as specimen to Pathology for review.  Good hemostasis is achieved in the operative field.  Decision is made  to not attempt anastomosis or ostomy formation at this operative setting. Given the ischemic changes in the proximal small bowel, a second look laparotomy will be necessary.  Therefore, a vacuum dressing is placed in the peritoneal cavity and applied in the usual fashion.  It is placed to suction with a good seal noted.  This will allow for control of the intraabdominal ascites and allow for easy access at second laparotomy to inspect the small bowel for viability.  The patient is then transported in critical condition by the anesthesia staff back to the intensive care unit.  Critical Care Medicine will be notified of the operative findings.   Velora Heckler, MD, [facility] Office: [phone]    TMG/MEDQ  D:  06/06/2013  T:  06/20/13  Job:  [id number]

## 2013-07-07 DEATH — deceased

## 2013-07-12 LAB — CULTURE, BLOOD (ROUTINE X 2)

## 2015-03-05 IMAGING — US US ABDOMEN COMPLETE
1 series · 14 of 25 positions shown · non-contrast
Comparison: None.

CLINICAL DATA: Elevated LFTs.

EXAM:
ULTRASOUND ABDOMEN COMPLETE

[Series 1: us abdomen complete · 0.27mm/px · 14 of 106 slices shown]
[im 1/106]
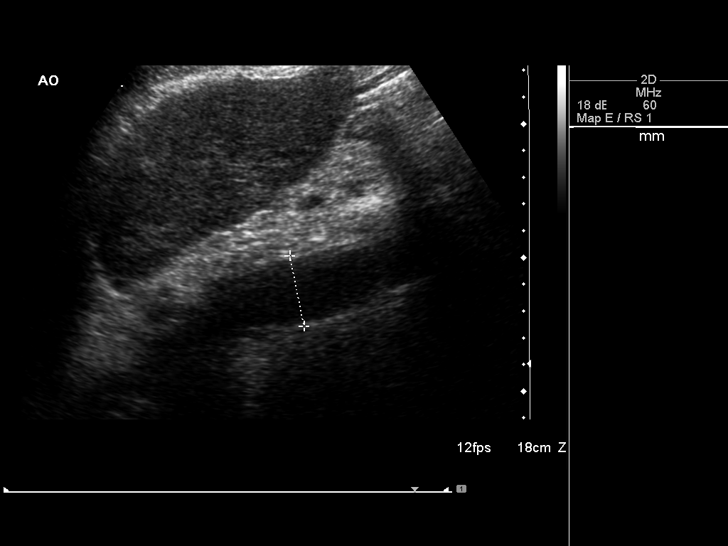
[im 9/106]
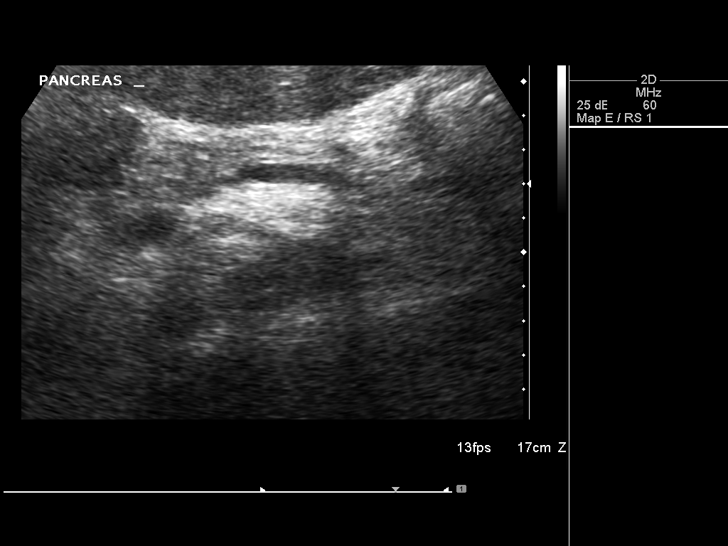
[im 18/106]
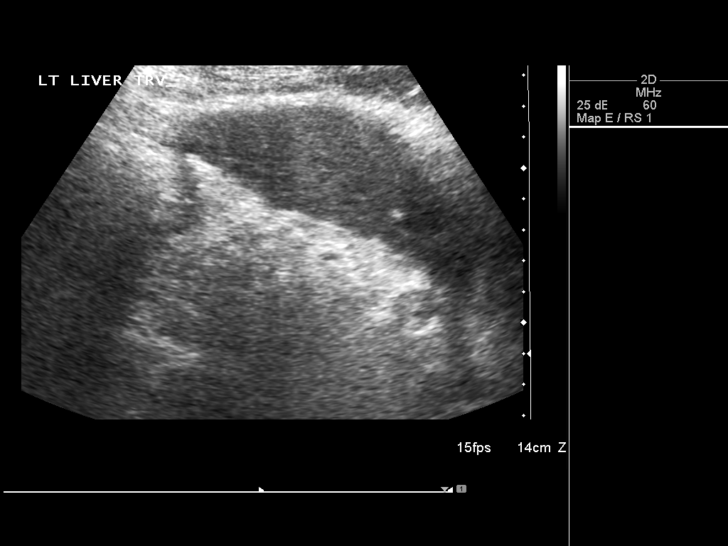
[im 27/106]
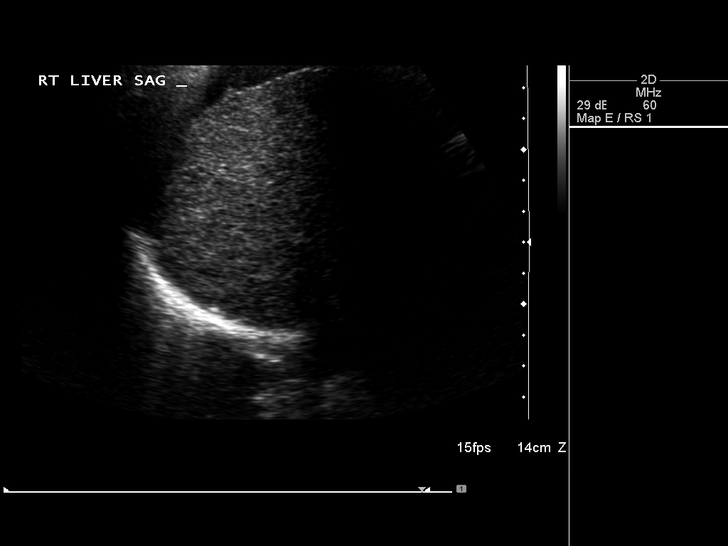
[im 36/106]
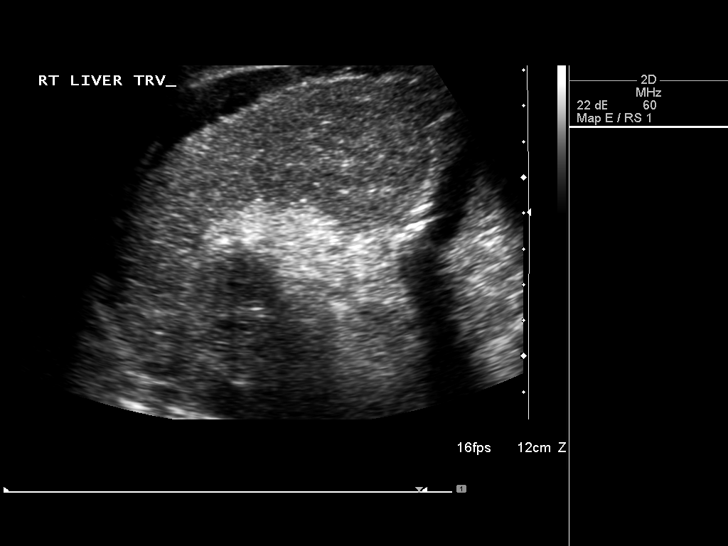
[im 40/106]
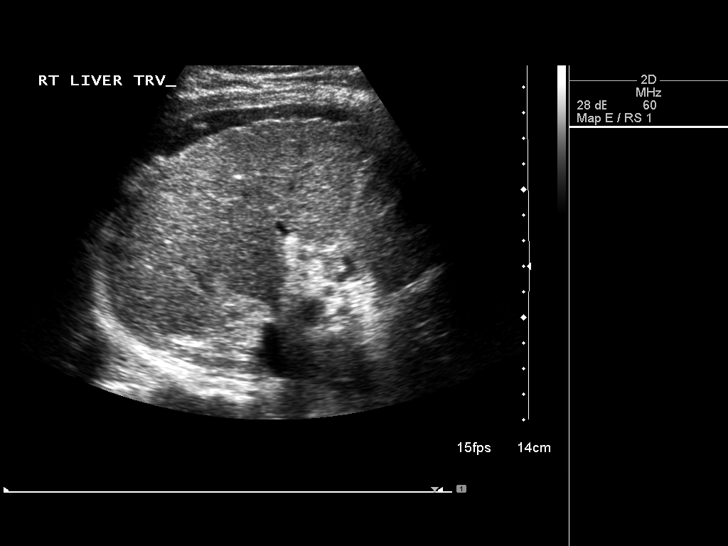
[im 49/106]
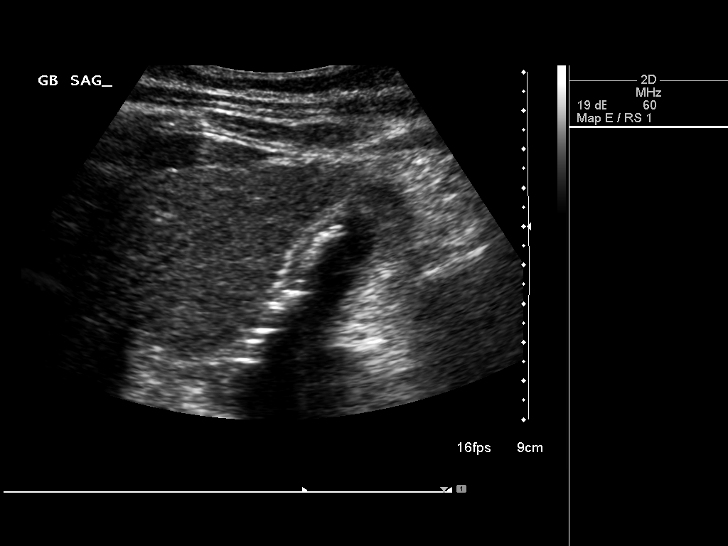
[im 57/106]
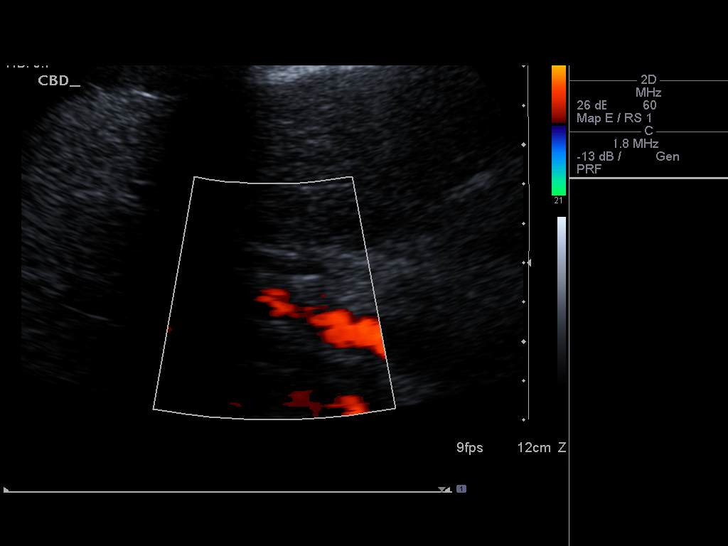
[im 66/106]
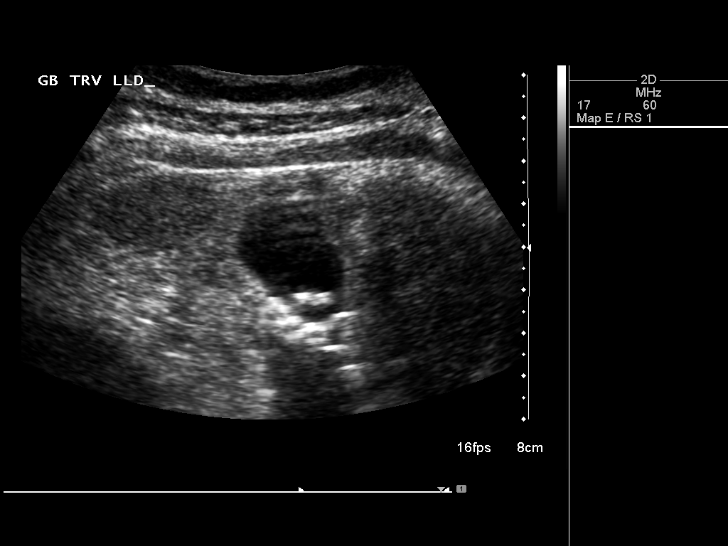
[im 71/106]
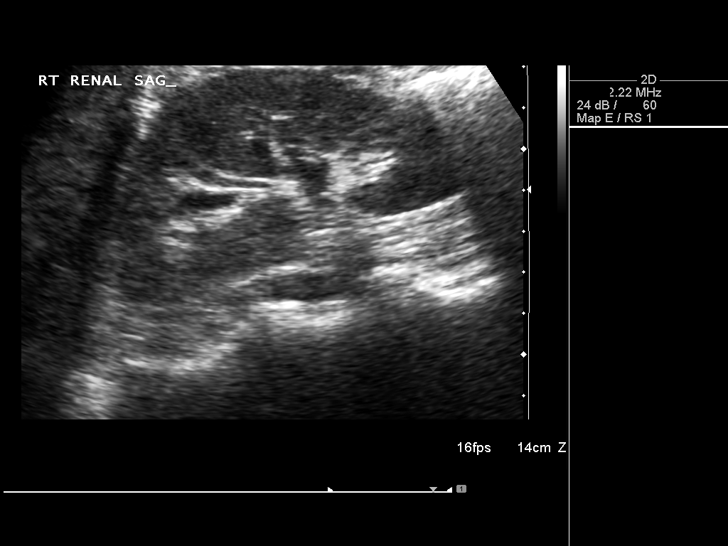
[im 79/106]
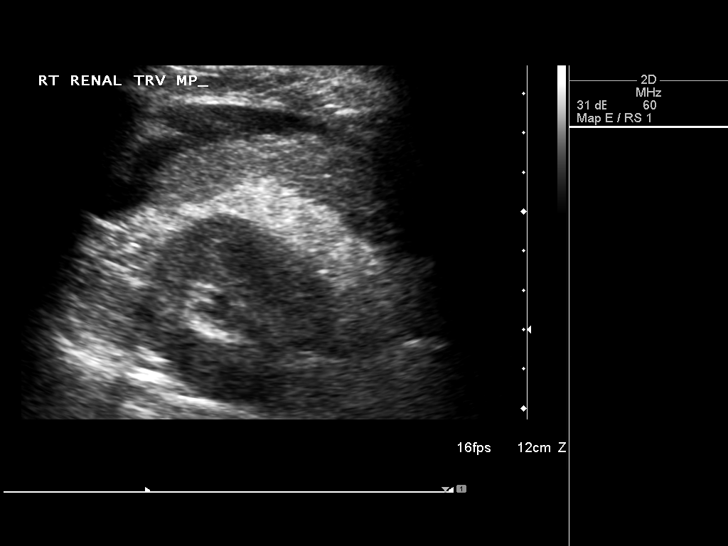
[im 88/106]
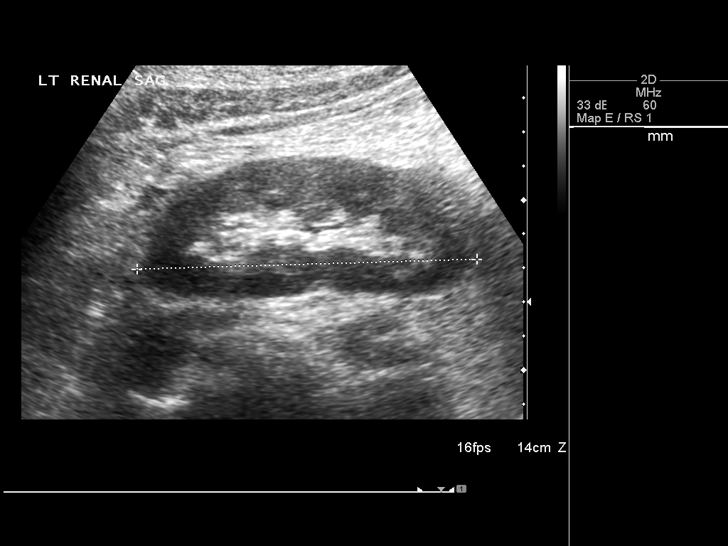
[im 97/106]
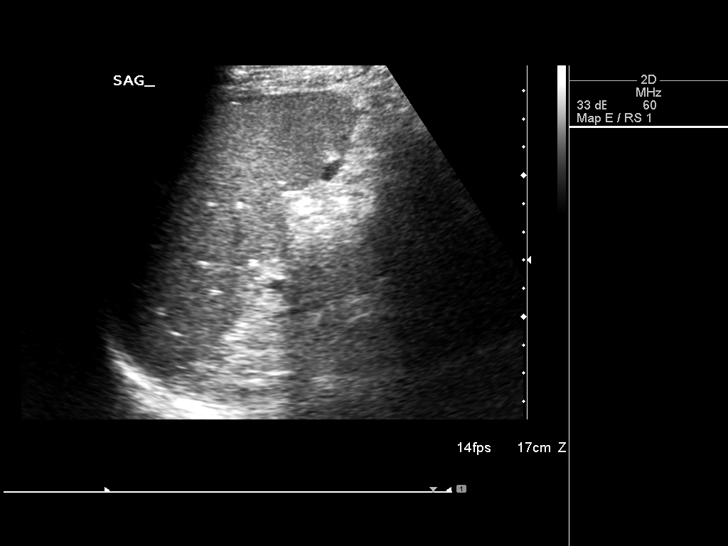
[im 106/106]
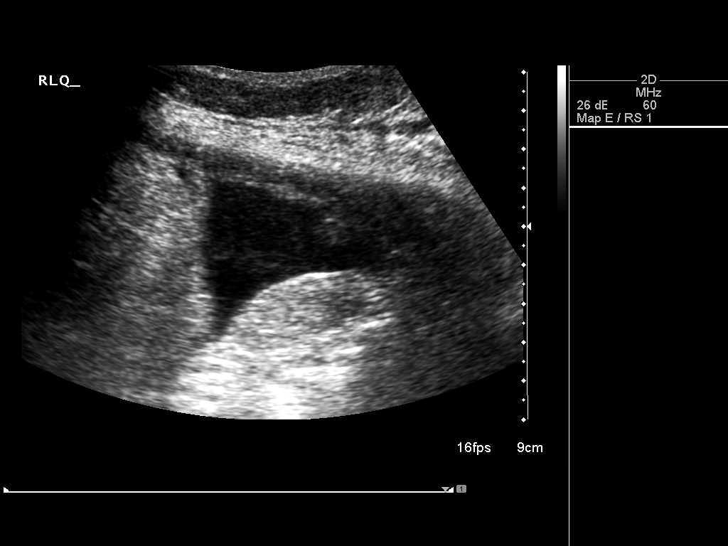

[14 of 25 positions shown; findings below may reference images not displayed]

FINDINGS: Gallbladder

Multiple mobile gallstones within the gallbladder. Gallbladder wall
is thickened at 6 mm. Negative sonographic Bil.

Common bile duct

Diameter: Normal caliber, 5-6 mm.

Liver

Heterogeneous echotexture throughout the liver. Suggestion of
nodular contours. Findings may reflect cirrhosis. Recommend clinical
correlation. No biliary ductal dilatation or focal abnormality.

IVC

No abnormality visualized.

Pancreas

Visualized portion unremarkable.

Spleen

Small scattered calcifications compatible with old granulomatous
disease.

Right Kidney

Length: 9.0 cm. Echogenicity within normal limits. No mass or
hydronephrosis visualized.

Left Kidney

Length: 9.4 cm. Echogenicity within normal limits. No mass or
hydronephrosis visualized.

Abdominal aorta

No aneurysm visualized.
IMPRESSION: Multiple gallstones. Gallbladder wall thickening without sonographic
Murphy sign likely reflects chronic cholecystitis.

Findings suspicious for cirrhosis. Recommend clinical correlation.

## 2015-04-15 IMAGING — CT CT ABD-PELV W/O CM
2 of 4 series · 14 of 46 positions shown, 16 images · non-contrast
Comparison: Abdominal ultrasound performed 05/05/2013

CLINICAL DATA: Hepatic cirrhosis; abdominal pain, distention and
shortness of breath.

EXAM:
CT ABDOMEN AND PELVIS WITHOUT CONTRAST
TECHNIQUE: Multidetector CT imaging of the abdomen and pelvis was performed
following the standard protocol without intravenous contrast.

[Series 2: abd/ pelvis 5.0 i30f 1 · axial · 0.85mm/px · z∈[-462,-26]mm · 11 of 105 slices shown, 13 images]
[im 9/105  soft-tissue]
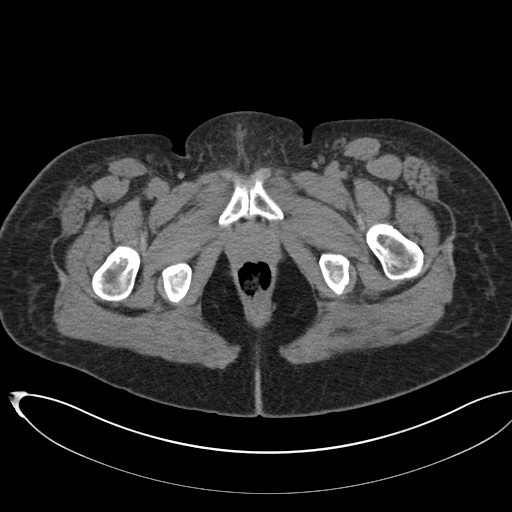
[im 9/105  bone]
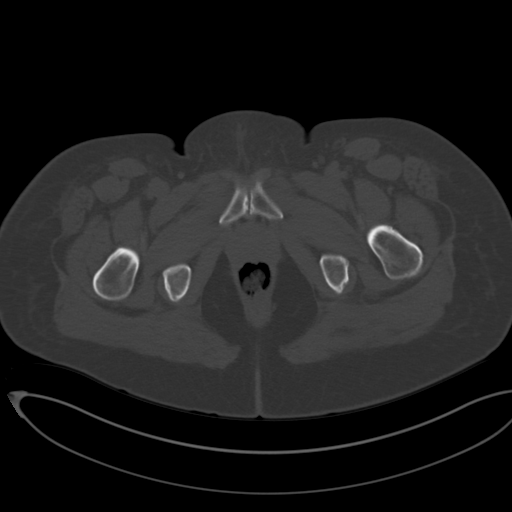
[im 18/105  soft-tissue]
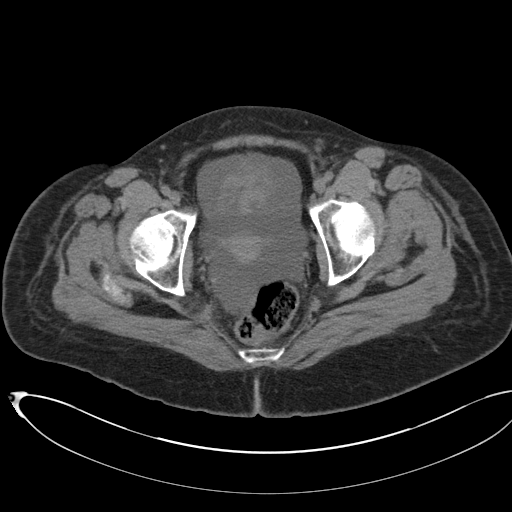
[im 27/105  soft-tissue]
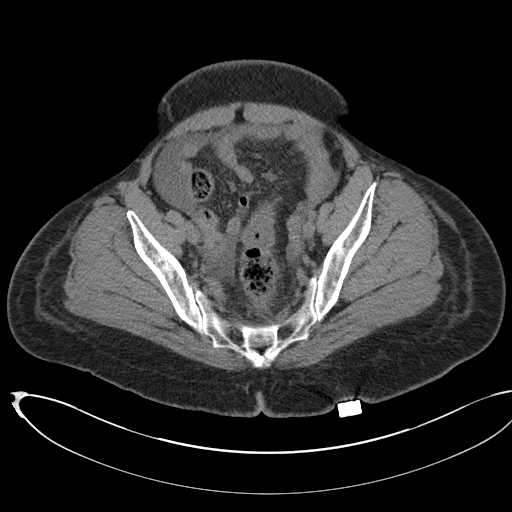
[im 35/105  soft-tissue]
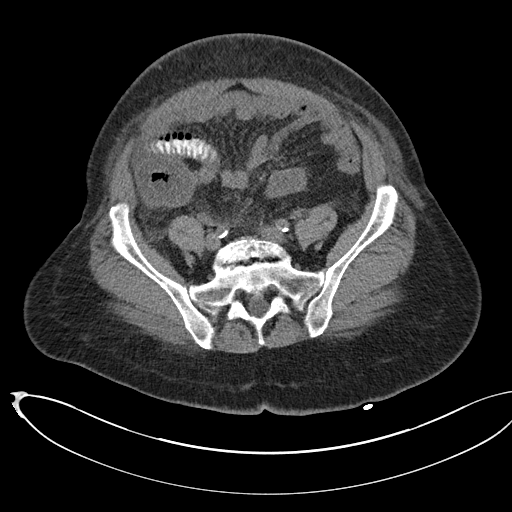
[im 44/105  soft-tissue]
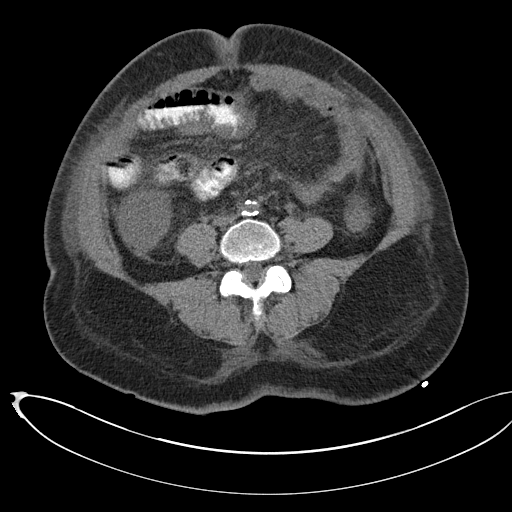
[im 53/105  soft-tissue]
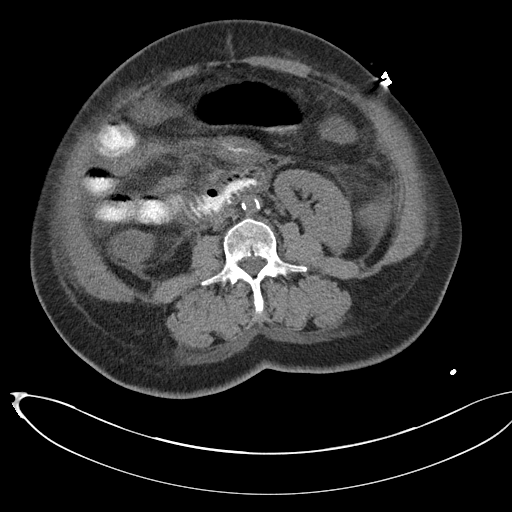
[im 61/105  soft-tissue]
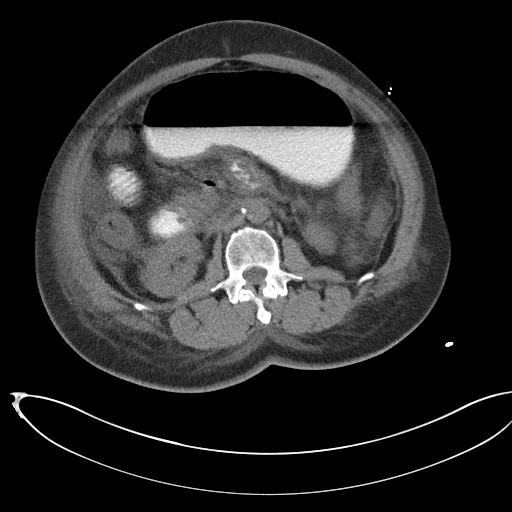
[im 70/105  soft-tissue]
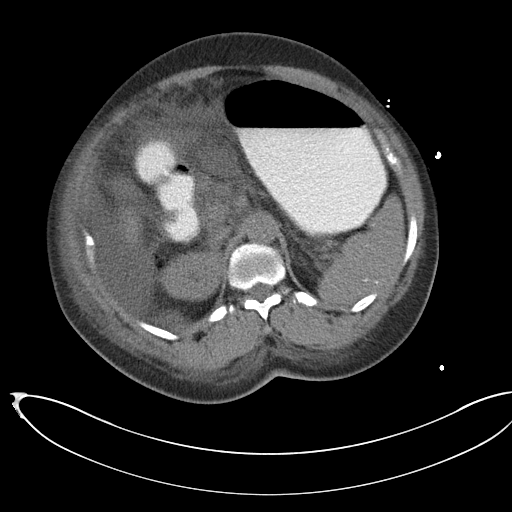
[im 79/105  soft-tissue]
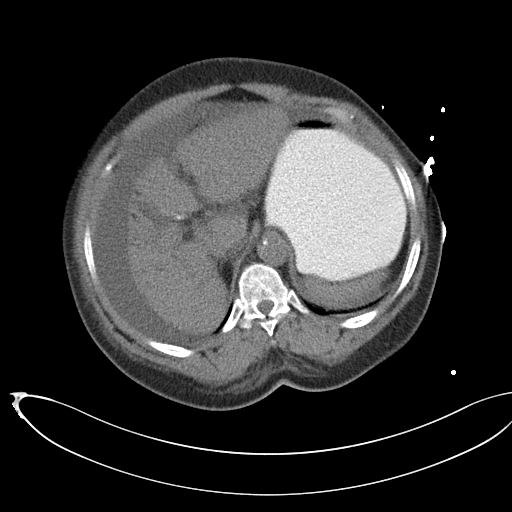
[im 79/105  bone]
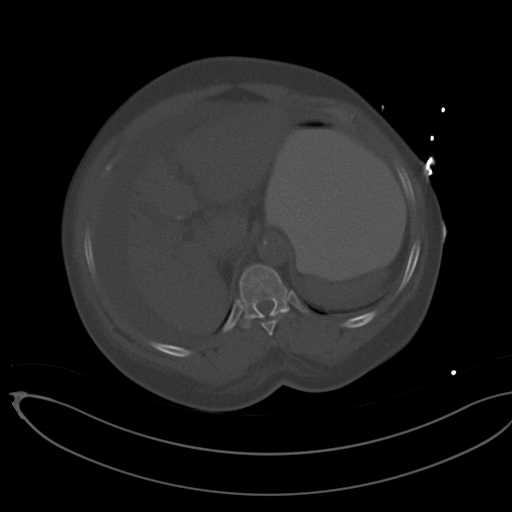
[im 87/105  soft-tissue]
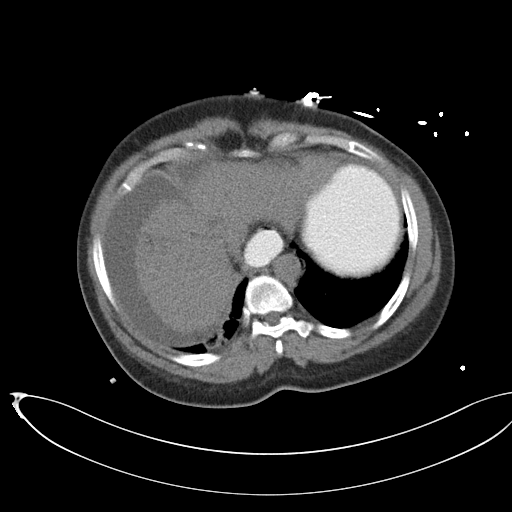
[im 96/105  soft-tissue]
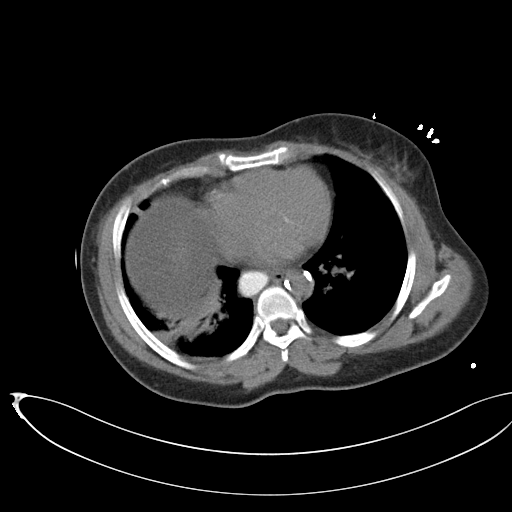

[Series 5: cor st · coronal · 0.96mm/px · 3 of 110 slices shown]
[im 37/110  soft-tissue]
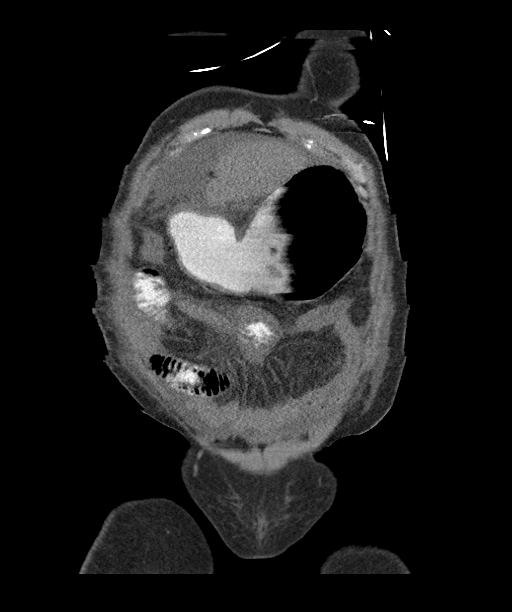
[im 49/110  soft-tissue]
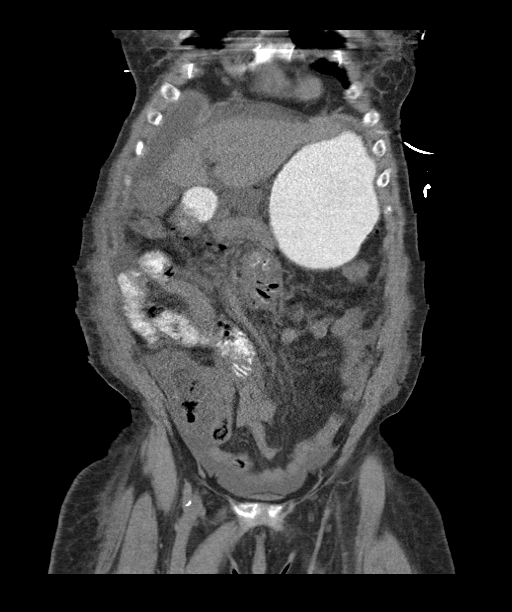
[im 61/110  soft-tissue]
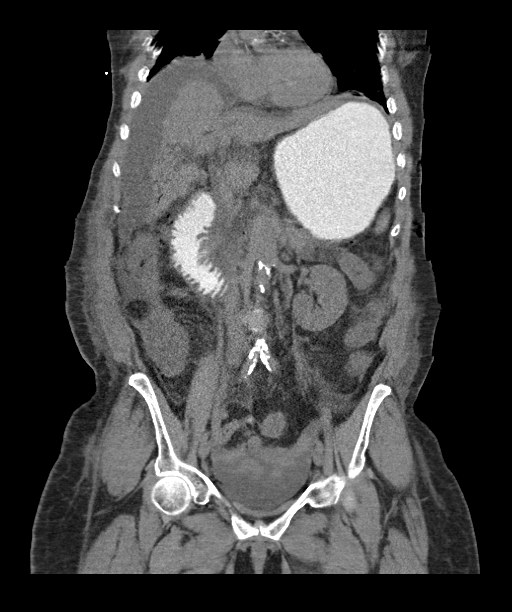

[14 of 46 positions shown; findings below may reference images not displayed]

FINDINGS: Right basilar airspace opacification raises concern for pneumonia,
given associated air bronchograms. The lung bases are not well
assessed due to motion artifact. Mild left basilar atelectasis is
seen. A prominent calcified granuloma is noted at the right lung
base.

There is small to moderate volume ascites noted surrounding the
liver, and tracking inferiorly along the paracolic gutters and about
small bowel loops. A small amount of free fluid is seen within the
pelvis. Scattered small foci of air are seen within the periphery of
the liver, compatible with portal venous gas. No dominant hepatic
mass is seen, though evaluation is suboptimal without contrast.
Numerous calcified granulomata are seen within the spleen. Stones
are noted layering dependently within the gallbladder; the
gallbladder is difficult to fully assess due to surrounding fluid.

A large amount of free air is seen within the portal venous system,
raising suspicion for underlying bowel ischemia. The pancreas is
grossly unremarkable in appearance, though difficult to fully assess
given adjacent fluid. The adrenal glands are within normal limits.

Nonspecific perinephric stranding and fluid are noted bilaterally.
The kidneys are otherwise unremarkable in appearance. There is no
evidence of hydronephrosis. No renal or ureteral stones are seen.

The small bowel is largely decompressed, aside from contrast within
the proximal small bowel loops. There is wall thickening along the
proximal jejunum, possibly reflecting an infectious or inflammatory
process. The stomach is largely filled with contrast; a small hiatal
hernia is seen. Contrast within the distal esophagus raises concern
for underlying gastroesophageal reflux or esophageal dysmotility.
Diffuse calcification is noted along the abdominal aorta and its
branches.

There is diffuse wall thickening involving the cecum and ascending
colon, and to a lesser extent the remainder of the colon. As
described above, fluid is noted tracking along the paracolic
gutters. There appears to be a small amount of air at the right
paracolic gutter; this is thought to reflect pneumatosis. This may
be secondary to ischemic colitis, or possibly an infectious or
inflammatory colitis with associated necrosis.

The bladder is not well characterized due to adjacent ascites. The
uterus is grossly unremarkable in appearance. The ovaries are
relatively symmetric. No suspicious adnexal masses are seen. No
inguinal lymphadenopathy is appreciated.

No acute osseous abnormalities are identified. Disc space narrowing
and vacuum phenomenon are seen at L5-S1.
IMPRESSION: 1. Large amount of free air noted within the portal venous system,
raising suspicion for underlying bowel ischemia. Scattered small
foci of air within the periphery of the liver are also compatible
with portal venous gas.
2. Diffuse wall thickening involving the cecum and ascending colon,
and to a lesser extent the remainder of the colon. There appears to
be a small amount of air at the right pericolic gutter; this is
thought reflect pneumatosis. This may be secondary to segmental
ischemic colitis, or possibly an infectious or inflammatory colitis
with associated necrosis, given the large amount of portal venous
gas.
3. Small to moderate volume ascites noted surrounding the liver,
tracking inferiorly along the paracolic gutters and into the pelvis.
4. Right basilar airspace opacification raises concern for
pneumonia, given associated air bronchograms.
5. Small hiatal hernia seen. Contrast within the distal esophagus
raises concern for underlying gastroesophageal reflux or esophageal
dysmotility.
6. Wall thickening along the proximal jejunum may reflect an
infectious or inflammatory process, or may be reactive in nature.
7. Diffuse calcification along the abdominal aorta and its branches.

These results were called by telephone at the time of interpretation
on 06/15/2013 at [DATE] to the E-Link physician on call, who
verbally acknowledged these results.

## 2015-04-15 IMAGING — CR DG CHEST 1V PORT
1 series · 1 of 1 positions shown · non-contrast
Comparison: Is radiographed January 30, 2011

CLINICAL DATA: Liver disease, shortness of breath.

EXAM:
PORTABLE CHEST - 1 VIEW

[AP]
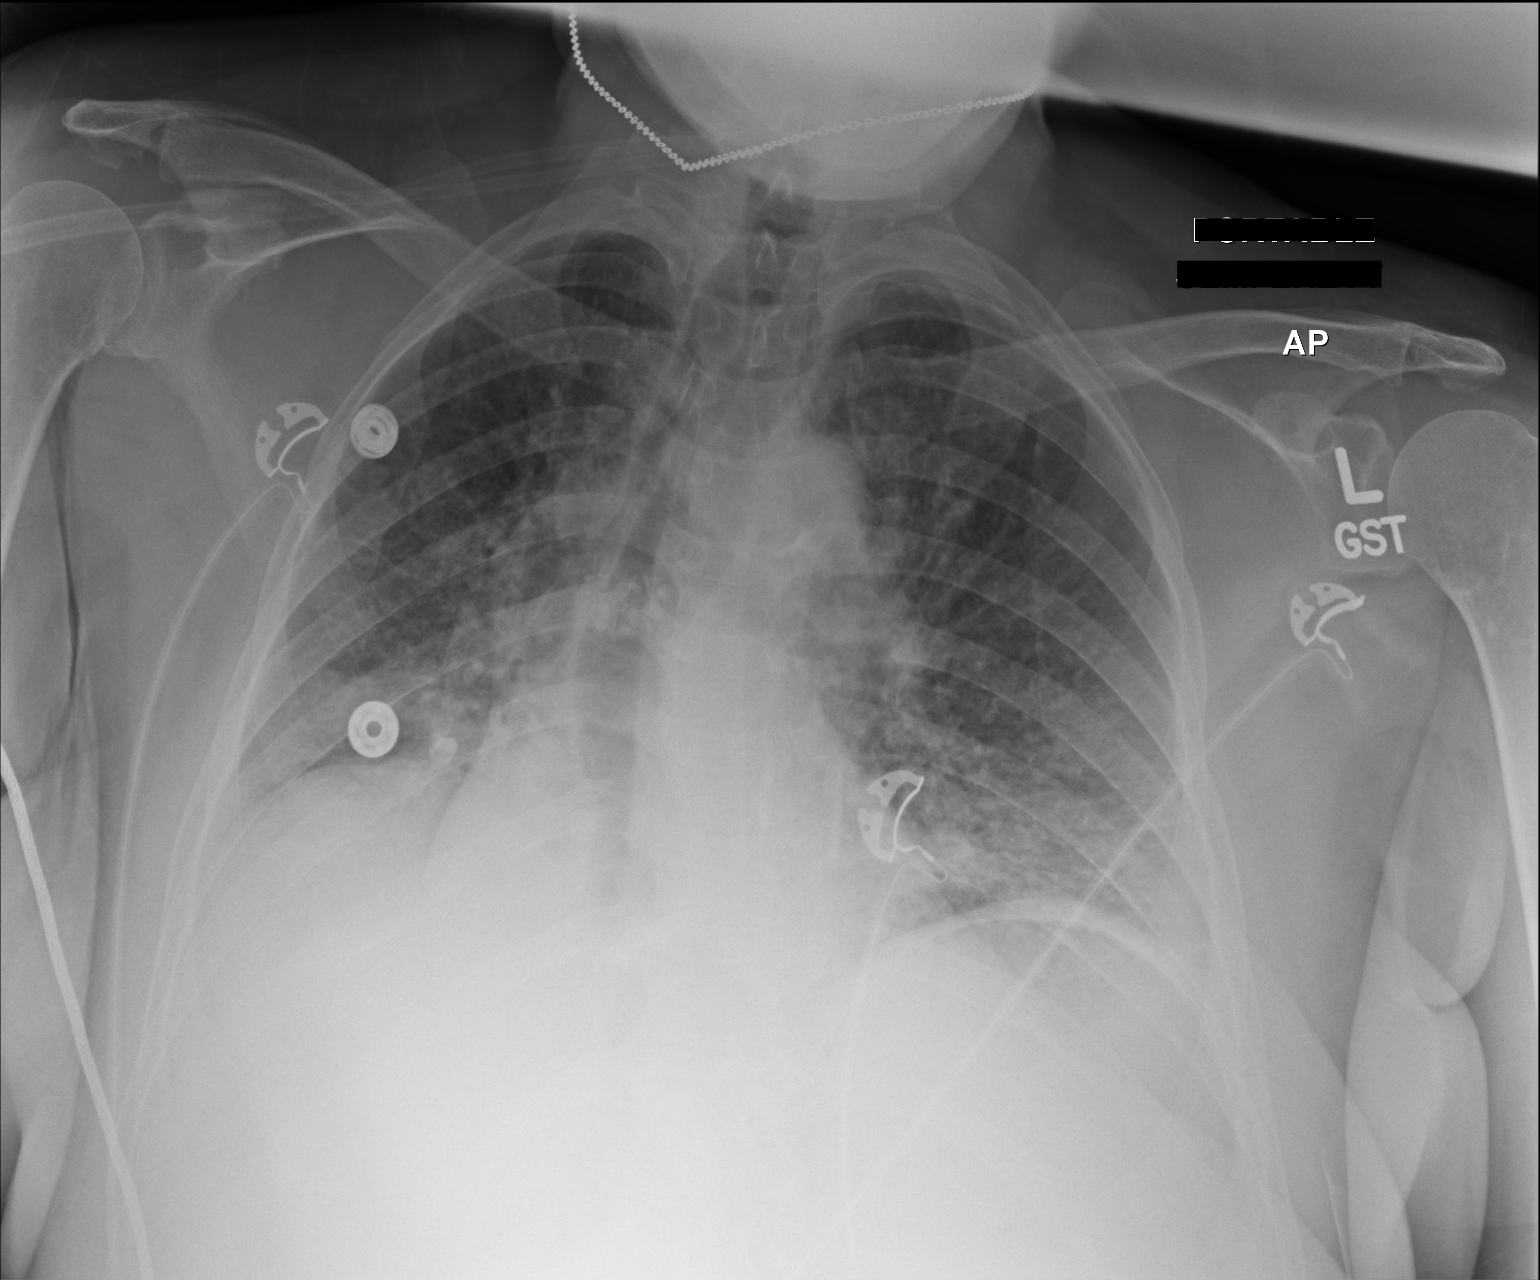

[1 of 1 positions shown; findings below may reference images not displayed]

FINDINGS: Cardiomediastinal silhouette is unremarkable, mild interstitial
prominence with strandy densities in the lung bases in this low
inspiratory portable examination with crowded vasculature markings.
Right lower lobe calcified granuloma. No pleural effusions. No
pneumothorax.

Soft tissue planes and included osseous structures are
nonsuspicious. Multiple EKG lines overlie the patient and may
obscure subtle underlying pathology.
IMPRESSION: Mild interstitial prominence may reflect edema, with bibasilar
strandy densities favoring atelectasis.

  By: Haydee Toh
